# Patient Record
Sex: Female | Born: 1980
Health system: Southern US, Community
[De-identification: ages and names within clinical notes are randomized; demographics above are authoritative.]

## PROBLEM LIST (undated history)

## (undated) DIAGNOSIS — Z8249 Family history of ischemic heart disease and other diseases of the circulatory system: Secondary | ICD-10-CM

## (undated) DIAGNOSIS — I071 Rheumatic tricuspid insufficiency: Secondary | ICD-10-CM

## (undated) DIAGNOSIS — Z141 Cystic fibrosis carrier: Secondary | ICD-10-CM

## (undated) DIAGNOSIS — N6320 Unspecified lump in the left breast, unspecified quadrant: Secondary | ICD-10-CM

## (undated) HISTORY — DX: Unspecified lump in the left breast, unspecified quadrant: N63.20

## (undated) HISTORY — PX: LASIK: SHX215

## (undated) HISTORY — DX: Rheumatic tricuspid insufficiency: I07.1

## (undated) HISTORY — DX: Cystic fibrosis carrier: Z14.1

## (undated) HISTORY — DX: Family history of ischemic heart disease and other diseases of the circulatory system: Z82.49

## (undated) HISTORY — PX: WISDOM TOOTH EXTRACTION: SHX21

---

## 2009-09-19 ENCOUNTER — Other Ambulatory Visit: Admission: RE | Admit: 2009-09-19 | Discharge: 2009-09-19 | Payer: Self-pay | Admitting: Family Medicine

## 2011-07-11 ENCOUNTER — Other Ambulatory Visit: Payer: Self-pay | Admitting: Family Medicine

## 2011-07-11 MED ORDER — SCOPOLAMINE 1 MG/3DAYS TD PT72: 1.0000 | MEDICATED_PATCH | TRANSDERMAL | Status: AC

## 2012-01-16 LAB — OB RESULTS CONSOLE RPR: RPR: NONREACTIVE

## 2012-01-16 LAB — OB RESULTS CONSOLE GC/CHLAMYDIA
Chlamydia: NEGATIVE
Gonorrhea: NEGATIVE

## 2012-01-16 LAB — OB RESULTS CONSOLE RUBELLA ANTIBODY, IGM: Rubella: IMMUNE

## 2012-01-16 LAB — OB RESULTS CONSOLE ABO/RH: RH Type: POSITIVE

## 2012-01-16 LAB — OB RESULTS CONSOLE HEPATITIS B SURFACE ANTIGEN: Hepatitis B Surface Ag: NEGATIVE

## 2012-01-16 LAB — OB RESULTS CONSOLE GBS: GBS: NEGATIVE

## 2012-01-16 LAB — OB RESULTS CONSOLE ANTIBODY SCREEN: Antibody Screen: NEGATIVE

## 2012-01-16 LAB — OB RESULTS CONSOLE HIV ANTIBODY (ROUTINE TESTING): HIV: NONREACTIVE

## 2012-02-28 ENCOUNTER — Other Ambulatory Visit: Payer: Self-pay | Admitting: Family Medicine

## 2012-02-28 MED ORDER — CEPHALEXIN 500 MG PO CAPS: 500.0000 mg | ORAL_CAPSULE | Freq: Two times a day (BID) | ORAL | Status: DC

## 2012-03-04 NOTE — L&D Delivery Note (Signed)
Delivery Note At 5:18 PM a healthy female was delivered via Vaginal, Spontaneous Delivery (Presentation: ; Occiput Anterior).  APGAR: 9,9 ; weight pending.   Placenta status: Intact, Spontaneous.  Cord: 3 vessels with the following complications: None.    Anesthesia:  Epidural  Episiotomy: none Lacerations: Second degree, right labial Suture Repair: 3.0 vicryl rapide Est. Blood Loss (mL): 350cc  Mom to postpartum.  Baby to stay with mother. D/w pt circumcision in detail and desires to proceed.  Oliver Pila 08/07/2012, 5:49 PM

## 2012-07-07 LAB — OB RESULTS CONSOLE GBS: GBS: POSITIVE

## 2012-07-17 ENCOUNTER — Other Ambulatory Visit: Payer: Self-pay | Admitting: Obstetrics and Gynecology

## 2012-07-17 DIAGNOSIS — N63 Unspecified lump in unspecified breast: Secondary | ICD-10-CM

## 2012-07-21 ENCOUNTER — Other Ambulatory Visit: Payer: Self-pay | Admitting: Obstetrics and Gynecology

## 2012-07-21 ENCOUNTER — Ambulatory Visit
Admission: RE | Admit: 2012-07-21 | Discharge: 2012-07-21 | Disposition: A | Discharge status: Home / Self Care | Payer: 59 | Source: Ambulatory Visit | Attending: Obstetrics and Gynecology | Admitting: Obstetrics and Gynecology

## 2012-07-21 DIAGNOSIS — N63 Unspecified lump in unspecified breast: Secondary | ICD-10-CM

## 2012-08-03 ENCOUNTER — Telehealth (HOSPITAL_COMMUNITY): Payer: Self-pay | Admitting: *Deleted

## 2012-08-04 ENCOUNTER — Telehealth (HOSPITAL_COMMUNITY): Payer: Self-pay | Admitting: *Deleted

## 2012-08-04 ENCOUNTER — Encounter (HOSPITAL_COMMUNITY): Payer: Self-pay | Admitting: *Deleted

## 2012-08-04 NOTE — Telephone Encounter (Signed)
Preadmission screen  

## 2012-08-06 ENCOUNTER — Inpatient Hospital Stay (HOSPITAL_COMMUNITY): Admission: AD | Admit: 2012-08-06 | Payer: Self-pay | Source: Ambulatory Visit | Admitting: Obstetrics and Gynecology

## 2012-08-06 NOTE — H&P (Signed)
Amanda Costa is a 33 y.o. female G1P0 at 81 1/7 weeks (EDD 08/06/12 by 8 wek [place]) presenting for IOL given term status and favorable cervix.  PRenatal care complicated by +GBS status.  Pt is a CF carrier, but husband negative.  She noted an enlarged nodule in left axilla and recent US confirmed a slightly enlarged node, thought to be reactive.  Plan recheck in 4-6 weeks if does not resolve.  Maternal Medical History:  Contractions: Frequency: irregular.   Perceived severity is mild.    Fetal activity: Perceived fetal activity is normal.    Prenatal Complications - Diabetes: none.    OB History   Grav Para Term Preterm Abortions TAB SAB Ect Mult Living   1              Past Medical History  Diagnosis Date  . Tricuspid regurgitation   . Cystic fibrosis carrier   . Left breast lump     1-2 cm knot L axilla during end of pregnancy   Past Surgical History  Procedure Laterality Date  . Wisdom tooth extraction     Family History: family history includes Alcohol abuse in her paternal aunt, paternal grandfather, paternal grandmother, and paternal uncle; Arthritis in her maternal grandmother and mother; Asthma in her mother; Cancer in her paternal grandmother; Diabetes in her father and paternal grandmother; Hearing loss in her maternal uncle; Heart disease in her maternal uncle and mother; Hypertension in her mother; and Hypothyroidism in her mother. Social History:  reports that she has never smoked. She has never used smokeless tobacco. She reports that she does not drink alcohol or use illicit drugs.   Prenatal Transfer Tool  Maternal Diabetes: No Genetic Screening: Normal Maternal Ultrasounds/Referrals: Normal Fetal Ultrasounds or other Referrals:  None Maternal Substance Abuse:  No Significant Maternal Medications:  None Significant Maternal Lab Results:  Lab values include: Group B Strep positive Other Comments:  None  ROS    Last menstrual period 10/25/2011. Exam Physical  Exam  Constitutional: She is oriented to person, place, and time. She appears well-developed and well-nourished.  Cardiovascular: Normal rate and regular rhythm.   Respiratory: Effort normal and breath sounds normal.  GI: Soft. Bowel sounds are normal.  Genitourinary: Vagina normal and uterus normal.  Gravid Cervix 50/3/-2  Neurological: She is alert and oriented to person, place, and time.  Psychiatric: She has a normal mood and affect. Her behavior is normal.    Prenatal labs: ABO, Rh: A/Positive/-- (11/14 0000) Antibody: Negative (11/14 0000) Rubella: Immune (11/14 0000) RPR: Nonreactive (11/14 0000)  HBsAg: Negative (11/14 0000)  HIV: Non-reactive (11/14 0000)  GBS: Positive (05/06 0000)  First trimester screen and AFP WNL One hour GTT WNL  Assessment/Plan: Pt here for IOL at term.  Cervix favorable, plan AROM and pitocin.  +GBS will Rx with PCN.   Oliver Pila 08/06/2012, 10:06 PM

## 2012-08-07 ENCOUNTER — Inpatient Hospital Stay (HOSPITAL_COMMUNITY): Payer: 59 | Admitting: Anesthesiology

## 2012-08-07 ENCOUNTER — Encounter (HOSPITAL_COMMUNITY): Payer: Self-pay | Admitting: Anesthesiology

## 2012-08-07 ENCOUNTER — Encounter (HOSPITAL_COMMUNITY): Payer: Self-pay

## 2012-08-07 ENCOUNTER — Inpatient Hospital Stay (HOSPITAL_COMMUNITY)
Admission: RE | Admit: 2012-08-07 | Discharge: 2012-08-09 | DRG: 775 | Disposition: A | Discharge status: Home / Self Care | Payer: 59 | Source: Ambulatory Visit | Attending: Obstetrics and Gynecology | Admitting: Obstetrics and Gynecology

## 2012-08-07 DIAGNOSIS — Z22330 Carrier of Group B streptococcus: Secondary | ICD-10-CM

## 2012-08-07 DIAGNOSIS — O99892 Other specified diseases and conditions complicating childbirth: Secondary | ICD-10-CM | POA: Diagnosis present

## 2012-08-07 LAB — CBC
HCT: 34.1 % — ABNORMAL LOW (ref 36.0–46.0)
Hemoglobin: 11.8 g/dL — ABNORMAL LOW (ref 12.0–15.0)
MCH: 31.6 pg (ref 26.0–34.0)
MCHC: 34.6 g/dL (ref 30.0–36.0)
MCV: 91.4 fL (ref 78.0–100.0)
Platelets: 146 10*3/uL — ABNORMAL LOW (ref 150–400)
RBC: 3.73 MIL/uL — ABNORMAL LOW (ref 3.87–5.11)
RDW: 12.5 % (ref 11.5–15.5)
WBC: 9.8 10*3/uL (ref 4.0–10.5)

## 2012-08-07 LAB — RPR: RPR Ser Ql: NONREACTIVE

## 2012-08-07 MED ORDER — FENTANYL 2.5 MCG/ML BUPIVACAINE 1/10 % EPIDURAL INFUSION (WH - ANES)
14.0000 mL/h | INTRAMUSCULAR | Status: DC | PRN
Start: 2012-08-07 — End: 2012-08-07
  Administered 2012-08-07: 14 mL/h via EPIDURAL
  Filled 2012-08-07: qty 125

## 2012-08-07 MED ORDER — OXYCODONE-ACETAMINOPHEN 5-325 MG PO TABS
1.0000 | ORAL_TABLET | ORAL | Status: DC | PRN
  Administered 2012-08-08 – 2012-08-09 (×2): 1 via ORAL
  Filled 2012-08-07 (×2): qty 1

## 2012-08-07 MED ORDER — TETANUS-DIPHTH-ACELL PERTUSSIS 5-2.5-18.5 LF-MCG/0.5 IM SUSP: 0.5000 mL | Freq: Once | INTRAMUSCULAR | Status: DC

## 2012-08-07 MED ORDER — PENICILLIN G POTASSIUM 5000000 UNITS IJ SOLR
5.0000 10*6.[IU] | Freq: Once | INTRAVENOUS | Status: AC
  Administered 2012-08-07: 5 10*6.[IU] via INTRAVENOUS
  Filled 2012-08-07: qty 5

## 2012-08-07 MED ORDER — LIDOCAINE HCL (PF) 1 % IJ SOLN
INTRAMUSCULAR | Status: DC | PRN
  Administered 2012-08-07 (×4): 4 mL

## 2012-08-07 MED ORDER — IBUPROFEN 600 MG PO TABS
600.0000 mg | ORAL_TABLET | Freq: Four times a day (QID) | ORAL | Status: DC | PRN
  Administered 2012-08-07: 600 mg via ORAL
  Filled 2012-08-07: qty 1

## 2012-08-07 MED ORDER — LACTATED RINGERS IV SOLN
500.0000 mL | INTRAVENOUS | Status: DC | PRN
  Administered 2012-08-07: 1000 mL via INTRAVENOUS

## 2012-08-07 MED ORDER — TERBUTALINE SULFATE 1 MG/ML IJ SOLN: 0.2500 mg | Freq: Once | INTRAMUSCULAR | Status: DC | PRN

## 2012-08-07 MED ORDER — OXYTOCIN 40 UNITS IN LACTATED RINGERS INFUSION - SIMPLE MED
1.0000 m[IU]/min | INTRAVENOUS | Status: DC
  Administered 2012-08-07: 4 m[IU]/min via INTRAVENOUS
  Administered 2012-08-07: 2 m[IU]/min via INTRAVENOUS
  Administered 2012-08-07 (×2): 6 m[IU]/min via INTRAVENOUS
  Filled 2012-08-07: qty 1000

## 2012-08-07 MED ORDER — BUTORPHANOL TARTRATE 1 MG/ML IJ SOLN: 1.0000 mg | INTRAMUSCULAR | Status: DC | PRN

## 2012-08-07 MED ORDER — PRENATAL MULTIVITAMIN CH
1.0000 | ORAL_TABLET | Freq: Every day | ORAL | Status: DC
  Administered 2012-08-08: 1 via ORAL
  Filled 2012-08-07: qty 1

## 2012-08-07 MED ORDER — DIPHENHYDRAMINE HCL 50 MG/ML IJ SOLN: 12.5000 mg | INTRAMUSCULAR | Status: DC | PRN

## 2012-08-07 MED ORDER — OXYTOCIN BOLUS FROM INFUSION: 500.0000 mL | INTRAVENOUS | Status: DC

## 2012-08-07 MED ORDER — WITCH HAZEL-GLYCERIN EX PADS
1.0000 "application " | MEDICATED_PAD | CUTANEOUS | Status: DC | PRN
  Administered 2012-08-07: 1 via TOPICAL

## 2012-08-07 MED ORDER — LACTATED RINGERS IV SOLN
INTRAVENOUS | Status: DC
  Administered 2012-08-07: 1000 mL via INTRAVENOUS

## 2012-08-07 MED ORDER — PHENYLEPHRINE 40 MCG/ML (10ML) SYRINGE FOR IV PUSH (FOR BLOOD PRESSURE SUPPORT)
80.0000 ug | PREFILLED_SYRINGE | INTRAVENOUS | Status: DC | PRN
  Filled 2012-08-07: qty 2

## 2012-08-07 MED ORDER — ONDANSETRON HCL 4 MG/2ML IJ SOLN: 4.0000 mg | Freq: Four times a day (QID) | INTRAMUSCULAR | Status: DC | PRN

## 2012-08-07 MED ORDER — ONDANSETRON HCL 4 MG PO TABS: 4.0000 mg | ORAL_TABLET | ORAL | Status: DC | PRN

## 2012-08-07 MED ORDER — PHENYLEPHRINE 40 MCG/ML (10ML) SYRINGE FOR IV PUSH (FOR BLOOD PRESSURE SUPPORT)
80.0000 ug | PREFILLED_SYRINGE | INTRAVENOUS | Status: DC | PRN
  Filled 2012-08-07: qty 5
  Filled 2012-08-07: qty 2

## 2012-08-07 MED ORDER — LIDOCAINE HCL (PF) 1 % IJ SOLN
30.0000 mL | INTRAMUSCULAR | Status: DC | PRN
  Filled 2012-08-07: qty 30

## 2012-08-07 MED ORDER — ONDANSETRON HCL 4 MG/2ML IJ SOLN: 4.0000 mg | INTRAMUSCULAR | Status: DC | PRN

## 2012-08-07 MED ORDER — OXYCODONE-ACETAMINOPHEN 5-325 MG PO TABS: 1.0000 | ORAL_TABLET | ORAL | Status: DC | PRN

## 2012-08-07 MED ORDER — LACTATED RINGERS IV SOLN: 500.0000 mL | Freq: Once | INTRAVENOUS | Status: DC

## 2012-08-07 MED ORDER — CITRIC ACID-SODIUM CITRATE 334-500 MG/5ML PO SOLN: 30.0000 mL | ORAL | Status: DC | PRN

## 2012-08-07 MED ORDER — SIMETHICONE 80 MG PO CHEW: 80.0000 mg | CHEWABLE_TABLET | ORAL | Status: DC | PRN

## 2012-08-07 MED ORDER — BENZOCAINE-MENTHOL 20-0.5 % EX AERO
1.0000 "application " | INHALATION_SPRAY | CUTANEOUS | Status: DC | PRN
  Administered 2012-08-07: 1 via TOPICAL
  Filled 2012-08-07: qty 56

## 2012-08-07 MED ORDER — EPHEDRINE 5 MG/ML INJ
10.0000 mg | INTRAVENOUS | Status: DC | PRN
  Filled 2012-08-07: qty 4
  Filled 2012-08-07: qty 2

## 2012-08-07 MED ORDER — IBUPROFEN 600 MG PO TABS
600.0000 mg | ORAL_TABLET | Freq: Four times a day (QID) | ORAL | Status: DC
  Administered 2012-08-08 – 2012-08-09 (×6): 600 mg via ORAL
  Filled 2012-08-07 (×6): qty 1

## 2012-08-07 MED ORDER — EPHEDRINE 5 MG/ML INJ
10.0000 mg | INTRAVENOUS | Status: DC | PRN
  Filled 2012-08-07: qty 2

## 2012-08-07 MED ORDER — ACETAMINOPHEN 325 MG PO TABS: 650.0000 mg | ORAL_TABLET | ORAL | Status: DC | PRN

## 2012-08-07 MED ORDER — DIPHENHYDRAMINE HCL 25 MG PO CAPS: 25.0000 mg | ORAL_CAPSULE | Freq: Four times a day (QID) | ORAL | Status: DC | PRN

## 2012-08-07 MED ORDER — OXYTOCIN 40 UNITS IN LACTATED RINGERS INFUSION - SIMPLE MED: 62.5000 mL/h | INTRAVENOUS | Status: DC

## 2012-08-07 MED ORDER — LANOLIN HYDROUS EX OINT: TOPICAL_OINTMENT | CUTANEOUS | Status: DC | PRN

## 2012-08-07 MED ORDER — PENICILLIN G POTASSIUM 5000000 UNITS IJ SOLR
2.5000 10*6.[IU] | INTRAVENOUS | Status: DC
  Administered 2012-08-07: 2.5 10*6.[IU] via INTRAVENOUS
  Filled 2012-08-07 (×5): qty 2.5

## 2012-08-07 MED ORDER — ZOLPIDEM TARTRATE 5 MG PO TABS: 5.0000 mg | ORAL_TABLET | Freq: Every evening | ORAL | Status: DC | PRN

## 2012-08-07 MED ORDER — SENNOSIDES-DOCUSATE SODIUM 8.6-50 MG PO TABS
2.0000 | ORAL_TABLET | Freq: Every day | ORAL | Status: DC
  Administered 2012-08-07 – 2012-08-08 (×2): 2 via ORAL

## 2012-08-07 MED ORDER — DIBUCAINE 1 % RE OINT
1.0000 "application " | TOPICAL_OINTMENT | RECTAL | Status: DC | PRN
  Administered 2012-08-07 – 2012-08-09 (×2): 1 via RECTAL
  Filled 2012-08-07 (×2): qty 28

## 2012-08-07 NOTE — Anesthesia Procedure Notes (Signed)
Epidural Patient location during procedure: OB Start time: 08/07/2012 1:36 PM  Staffing Performed by: anesthesiologist   Preanesthetic Checklist Completed: patient identified, site marked, surgical consent, pre-op evaluation, timeout performed, IV checked, risks and benefits discussed and monitors and equipment checked  Epidural Patient position: sitting Prep: site prepped and draped and DuraPrep Patient monitoring: continuous pulse ox and blood pressure Approach: midline Injection technique: LOR air  Needle:  Needle type: Tuohy  Needle gauge: 17 G Needle length: 9 cm and 9 Needle insertion depth: 5 cm cm Catheter type: closed end flexible Catheter size: 19 Gauge Catheter at skin depth: 10 cm Test dose: negative  Assessment Events: blood not aspirated, injection not painful, no injection resistance, negative IV test and no paresthesia  Additional Notes Discussed risk of headache, infection, bleeding, nerve injury and failed or incomplete block.  Patient voices understanding and wishes to proceed.  Epidural placed easily on first attempt. No paresthesia.  Patient tolerated procedure well with no apparent complications.  Jasmine December, MDReason for block:procedure for pain

## 2012-08-07 NOTE — Progress Notes (Signed)
Patient ID: Amanda Costa, female   DOB: 10-Nov-1980, 32 y.o.   MRN: [id number] Pt very uncomfortable afeb VSS FHR reassuring Cervix 80/5+/0 per RN exam Awaiting epidural

## 2012-08-07 NOTE — Anesthesia Preprocedure Evaluation (Signed)
Anesthesia Evaluation  Patient identified by MRN, date of birth, ID band Patient awake    Reviewed: Allergy & Precautions, H&P , NPO status , Patient's Chart, lab work & pertinent test results, reviewed documented beta blocker date and time   History of Anesthesia Complications Negative for: history of anesthetic complications  Airway Mallampati: I TM Distance: >3 FB Neck ROM: full    Dental  (+) Teeth Intact   Pulmonary neg pulmonary ROS,  breath sounds clear to auscultation        Cardiovascular + Valvular Problems/Murmurs (tricuspid regurg - asymptomatic) Rhythm:regular Rate:Normal + Systolic murmurs    Neuro/Psych negative neurological ROS  negative psych ROS   GI/Hepatic negative GI ROS, Neg liver ROS,   Endo/Other  negative endocrine ROS  Renal/GU negative Renal ROS  negative genitourinary   Musculoskeletal   Abdominal   Peds  Hematology negative hematology ROS (+)   Anesthesia Other Findings   Reproductive/Obstetrics (+) Pregnancy                           Anesthesia Physical Anesthesia Plan  ASA: II  Anesthesia Plan: Epidural   Post-op Pain Management:    Induction:   Airway Management Planned:   Additional Equipment:   Intra-op Plan:   Post-operative Plan:   Informed Consent: I have reviewed the patients History and Physical, chart, labs and discussed the procedure including the risks, benefits and alternatives for the proposed anesthesia with the patient or authorized representative who has indicated his/her understanding and acceptance.     Plan Discussed with:   Anesthesia Plan Comments:         Anesthesia Quick Evaluation

## 2012-08-07 NOTE — Progress Notes (Signed)
   Subjective: Pt doing well, minimal contractions, not yet on pitocin  Objective: BP 128/77  Pulse 73  Temp(Src) 98.2 F (36.8 C) (Oral)  Resp 18  Ht 5\' 5"  (1.651 m)  Wt 68.947 kg (152 lb)  BMI 25.29 kg/m2  LMP 10/25/2011      FHT:  FHR: 140 bpm, variability: moderate,  accelerations:  Present,  decelerations:  Absent UC:   irregular, every 5-10 minutes SVE:   Dilation: 3 Effacement (%): 50 Station: -2 Exam by:: Dr Senaida Ores AROM clear  Labs: Lab Results  Component Value Date   WBC 9.8 08/07/2012   HGB 11.8* 08/07/2012   HCT 34.1* 08/07/2012   MCV 91.4 08/07/2012   PLT 146* 08/07/2012    Assessment / Plan: Pt already receiving PCN for +GBS.  Will start pitocin and follow progress.  Plans epidural  Danai Gotto W 08/07/2012, 8:54 AM

## 2012-08-08 ENCOUNTER — Encounter (HOSPITAL_COMMUNITY): Payer: Self-pay

## 2012-08-08 LAB — CBC
HCT: 29.1 % — ABNORMAL LOW (ref 36.0–46.0)
Hemoglobin: 10.2 g/dL — ABNORMAL LOW (ref 12.0–15.0)
MCH: 32.3 pg (ref 26.0–34.0)
MCHC: 35.1 g/dL (ref 30.0–36.0)
MCV: 92.1 fL (ref 78.0–100.0)
Platelets: 127 10*3/uL — ABNORMAL LOW (ref 150–400)
RBC: 3.16 MIL/uL — ABNORMAL LOW (ref 3.87–5.11)
RDW: 12.5 % (ref 11.5–15.5)
WBC: 13.3 10*3/uL — ABNORMAL HIGH (ref 4.0–10.5)

## 2012-08-08 NOTE — Anesthesia Postprocedure Evaluation (Signed)
  Anesthesia Post-op Note  Patient: Amanda Costa  Procedure(s) Performed: * No procedures listed *  Patient Location: Mother/Baby  Anesthesia Type:Epidural  Level of Consciousness: awake and alert   Airway and Oxygen Therapy: Patient Spontanous Breathing  Post-op Pain: mild  Post-op Assessment: Patient's Cardiovascular Status Stable, Respiratory Function Stable, No signs of Nausea or vomiting, Pain level controlled and No headache  Post-op Vital Signs: Reviewed and stable  Complications: No apparent anesthesia complications

## 2012-08-08 NOTE — Progress Notes (Signed)
Post Partum Day 1 Subjective: no complaints, up ad lib and tolerating PO  Objective: Blood pressure 95/59, pulse 60, temperature 98.2 F (36.8 C), temperature source Oral, resp. rate 18, height 5\' 5"  (1.651 m), weight 68.947 kg (152 lb), last menstrual period 10/25/2011, SpO2 99.00%.  Physical Exam:  General: alert and cooperative Lochia: appropriate Uterine Fundus: firm    Recent Labs  08/07/12 0725 08/08/12 0630  HGB 11.8* 10.2*  HCT 34.1* 29.1*    Assessment/Plan: Plan for discharge tomorrow   LOS: 1 day   Amanda Costa W 08/08/2012, 7:48 AM

## 2012-08-08 NOTE — Discharge Summary (Signed)
Obstetric Discharge Summary Reason for Admission: induction of labor Prenatal Procedures: none Intrapartum Procedures: spontaneous vaginal delivery Postpartum Procedures: none Complications-Operative and Postpartum: second degree perineal laceration, small right periurethral Hemoglobin  Date Value Range Status  08/07/2012 11.8* 12.0 - 15.0 g/dL Final     HCT  Date Value Range Status  08/07/2012 34.1* 36.0 - 46.0 % Final    Physical Exam:  General: alert and cooperative Lochia: appropriate Uterine Fundus: firm   Discharge Diagnoses: Term Pregnancy-delivered  Discharge Information: Date: 08/08/2012 Activity: pelvic rest Diet: routine Medications: Ibuprofen and Percocet Condition: stable Instructions: refer to practice specific booklet Discharge to: home Follow-up Information   Follow up with Oliver Pila, MD. Schedule an appointment as soon as possible for a visit in 6 weeks.   Contact information:   [address], [address] [place] [address] [phone]       Newborn Data: Live born female  Birth Weight: 8 lb 10.5 oz (3925 g) APGAR: 9, 9  Home with mother.  Oliver Pila 08/08/2012, 6:21 AM

## 2012-08-09 MED ORDER — OXYCODONE-ACETAMINOPHEN 5-325 MG PO TABS: 1.0000 | ORAL_TABLET | ORAL | Status: DC | PRN

## 2012-08-09 MED ORDER — IBUPROFEN 600 MG PO TABS: 600.0000 mg | ORAL_TABLET | Freq: Four times a day (QID) | ORAL | Status: DC

## 2012-08-09 NOTE — Progress Notes (Signed)
Post Partum Day 2 Subjective: no complaints, up ad lib and tolerating PO  Objective: Blood pressure 109/61, pulse 62, temperature 98.3 F (36.8 C), temperature source Oral, resp. rate 17, height 5\' 5"  (1.651 m), weight 68.947 kg (152 lb), last menstrual period 10/25/2011, SpO2 99.00%, unknown if currently breastfeeding.  Physical Exam:  General: alert and cooperative Lochia: appropriate Uterine Fundus: firm    Recent Labs  08/07/12 0725 08/08/12 0630  HGB 11.8* 10.2*  HCT 34.1* 29.1*    Assessment/Plan: Discharge home   LOS: 2 days   Samayah Novinger W 08/09/2012, 8:36 AM

## 2012-08-10 ENCOUNTER — Other Ambulatory Visit: Payer: Self-pay | Admitting: Obstetrics and Gynecology

## 2012-08-10 DIAGNOSIS — N63 Unspecified lump in unspecified breast: Secondary | ICD-10-CM

## 2012-08-18 ENCOUNTER — Ambulatory Visit
Admission: RE | Admit: 2012-08-18 | Discharge: 2012-08-18 | Disposition: A | Discharge status: Home / Self Care | Payer: 59 | Source: Ambulatory Visit | Attending: Obstetrics and Gynecology | Admitting: Obstetrics and Gynecology

## 2012-08-18 DIAGNOSIS — N63 Unspecified lump in unspecified breast: Secondary | ICD-10-CM

## 2012-08-20 NOTE — Progress Notes (Signed)
Ur chart review completed.  

## 2012-08-21 ENCOUNTER — Encounter (HOSPITAL_COMMUNITY): Payer: Self-pay

## 2012-09-01 ENCOUNTER — Encounter (INDEPENDENT_AMBULATORY_CARE_PROVIDER_SITE_OTHER): Payer: Self-pay | Admitting: General Surgery

## 2012-09-01 ENCOUNTER — Ambulatory Visit (INDEPENDENT_AMBULATORY_CARE_PROVIDER_SITE_OTHER): Payer: Commercial Managed Care - PPO | Admitting: General Surgery

## 2012-09-01 VITALS — BP 118/70 | HR 68 | Temp 98.2°F | Resp 14 | Ht 65.0 in | Wt 133.4 lb

## 2012-09-01 DIAGNOSIS — R229 Localized swelling, mass and lump, unspecified: Secondary | ICD-10-CM

## 2012-09-01 DIAGNOSIS — R2232 Localized swelling, mass and lump, left upper limb: Secondary | ICD-10-CM

## 2012-09-01 NOTE — Progress Notes (Signed)
Patient ID: Amanda Costa, female   DOB: [birth year], 32 y.o.   MRN: [id number]  Chief Complaint  Patient presents with  . New Evaluation    eval lt axillary    HPI Amanda Costa is a 32 y.o. female.  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 Prior to delivery she noted a left axillary mass that may have been associated with the breaking her skin from shaving. This area was followed by ultrasound and enlarged over some time. It appeared to be due to an infection and this was reactive lymphadenopathy. Her last ultrasound is listed below. She has no prior history of any breast complaints or any issues. She has no family history. She has not been treated with any antibiotics or any other specific treatments for this mass. Since the last ultrasound of this area is gone down considerably and she really has no complaints referable to this area right now. Past Medical History  Diagnosis Date  . Tricuspid regurgitation   . Cystic fibrosis carrier   . Left breast lump     1-2 cm knot L axilla during end of pregnancy    Past Surgical History  Procedure Laterality Date  . Wisdom tooth extraction      Family History  Problem Relation Age of Onset  . Arthritis Mother   . Asthma Mother   . Heart disease Mother   . Hypertension Mother   . Hypothyroidism Mother   . Diabetes Father   . Heart disease Maternal Uncle   . Hearing loss Maternal Uncle   . Alcohol abuse Paternal Aunt   . Alcohol abuse Paternal Uncle   . Arthritis Maternal Grandmother   . Alcohol abuse Paternal Grandmother   . Diabetes Paternal Grandmother   . Cancer Paternal Grandmother     skin  . Alcohol abuse Paternal Grandfather     Social History History  Substance Use Topics  . Smoking status: Never Smoker   . Smokeless tobacco: Never Used  . Alcohol Use: No    No Known  Allergies  Current Outpatient Prescriptions  Medication Sig Dispense Refill  . Prenatal Vit-Fe Fumarate-FA (PRENATAL MULTIVITAMIN) TABS Take 1 tablet by mouth daily at 12 noon.      Marland Kitchen ibuprofen (ADVIL,MOTRIN) 600 MG tablet Take 1 tablet (600 mg total) by mouth every 6 (six) hours.  30 tablet  0  . oxyCODONE-acetaminophen (PERCOCET/ROXICET) 5-325 MG per tablet Take 1-2 tablets by mouth every 4 (four) hours as needed.  30 tablet  0   No current facility-administered medications for this visit.    Review of Systems Review of Systems  Constitutional: Negative for fever, chills and unexpected weight change.  HENT: Negative for hearing loss, congestion, sore throat, trouble swallowing and voice change.   Eyes: Negative for visual disturbance.  Respiratory: Negative for cough and wheezing.   Cardiovascular: Negative for chest pain, palpitations and leg swelling.  Gastrointestinal: Negative for nausea, vomiting, abdominal pain, diarrhea, constipation, blood in stool, abdominal distention and anal bleeding.  Genitourinary: Negative for hematuria, vaginal bleeding and difficulty urinating.  Musculoskeletal: Negative for arthralgias.  Skin: Negative for rash and wound.  Neurological: Negative for seizures, syncope and headaches.  Hematological: Negative for adenopathy. Does not bruise/bleed easily.  Psychiatric/Behavioral: Negative for confusion.    Blood pressure 118/70, pulse 68, temperature 98.2 F (36.8 C), temperature source Temporal,  resp. rate 14, height 5\' 5"  (1.651 m), weight 133 lb 6.4 oz (60.51 kg), last menstrual period 10/25/2011.  Physical Exam Physical Exam  Vitals reviewed. Constitutional: She appears well-developed and well-nourished.  Pulmonary/Chest: Right breast exhibits no inverted nipple, no mass, no nipple discharge, no skin change and no tenderness. Left breast exhibits no inverted nipple, no mass, no nipple discharge, no skin change and no tenderness.   Lymphadenopathy:    She has no cervical adenopathy.    She has axillary adenopathy.       Right axillary: No pectoral and no lateral adenopathy present.       Left axillary: Lateral (small less than 1 cm palpable axillary node, mobile nontender, no evidence infection) adenopathy present.       Right: No supraclavicular adenopathy present.       Left: No supraclavicular adenopathy present.    Data Reviewed LEFT BREAST ULTRASOUND  Comparison: Jul 29, 2012  Findings: Ultrasound is performed, showing a multicystic lesion in  the palpable area left axilla which has enlarged measuring 2.9 x 1  x 1.4 cm. This could be a focal area of infection/abscess.  IMPRESSION:  Suspicious findings.  RECOMMENDATION:  Surgical excision of palpable area left axilla.  I have discussed the findings and recommendations with the patient.  Results were also provided in writing at the conclusion of the  visit. If applicable, a reminder letter will be sent to the  patient regarding the next appointment.  BI-RADS CATEGORY 4: Suspicious abnormality - biopsy should be  considered.   Assessment    Left axillary mass    Plan    I examined as well as reviewed both of her ultrasounds. Clinically this area is nearly gone on her exam right now compared to her last ultrasound and she correlates this as well. I think is to work with this does appear to be reactive lymphadenopathy. I do think the best plan right now would be to just follow this I think there is a very low suspicion that this is either lymphoma or associated with the breast cancer. I will plan on seeing her back in 3 weeks. If this area completely disappears on her exam I asked her to call me I will set her up for an ultrasound to document that. If there is area does not completely go away or enlarges again then we will need to biopsy this. I discussed possible antibiotics. I think with her breast-feeding the fact this is already getting better on its own  that we will hold off on this for now as well.        [name] 09/01/2012, 10:16 AM

## 2012-09-28 ENCOUNTER — Encounter (INDEPENDENT_AMBULATORY_CARE_PROVIDER_SITE_OTHER): Payer: Commercial Managed Care - PPO | Admitting: General Surgery

## 2014-01-03 ENCOUNTER — Encounter (INDEPENDENT_AMBULATORY_CARE_PROVIDER_SITE_OTHER): Payer: Self-pay | Admitting: General Surgery

## 2014-02-08 ENCOUNTER — Ambulatory Visit: Payer: 59 | Admitting: Family Medicine

## 2014-10-19 LAB — OB RESULTS CONSOLE RPR: RPR: NONREACTIVE

## 2014-10-19 LAB — OB RESULTS CONSOLE HIV ANTIBODY (ROUTINE TESTING): HIV: NONREACTIVE

## 2014-10-19 LAB — OB RESULTS CONSOLE HEPATITIS B SURFACE ANTIGEN: Hepatitis B Surface Ag: NEGATIVE

## 2014-10-19 LAB — OB RESULTS CONSOLE ABO/RH: RH Type: POSITIVE

## 2014-10-19 LAB — OB RESULTS CONSOLE GC/CHLAMYDIA
Chlamydia: NEGATIVE
Gonorrhea: NEGATIVE

## 2014-10-19 LAB — OB RESULTS CONSOLE RUBELLA ANTIBODY, IGM: Rubella: IMMUNE

## 2014-10-19 LAB — OB RESULTS CONSOLE ANTIBODY SCREEN: Antibody Screen: NEGATIVE

## 2015-03-05 NOTE — L&D Delivery Note (Signed)
Delivery Note At 3:15 AM a viable female was delivered via Vaginal, Spontaneous Delivery (Presentation: Right Occiput Anterior).  APGAR: 9, 9; weight pending.   Placenta status: Intact, Spontaneous.  Cord: 3 vessels with the following complications: None.  Anesthesia: Epidural  Episiotomy: None Lacerations: 2nd degree Suture Repair: 3.0 vicryl rapide Est. Blood Loss (mL): 200  Mom to postpartum.  Baby to Couplet care / Skin to Skin.  Discussed circumcision procedure and risks.  Sophonie Goforth D 04/27/2015, 3:43 AM

## 2015-03-21 ENCOUNTER — Ambulatory Visit (INDEPENDENT_AMBULATORY_CARE_PROVIDER_SITE_OTHER): Payer: 59 | Admitting: Internal Medicine

## 2015-03-21 ENCOUNTER — Encounter: Payer: Self-pay | Admitting: Internal Medicine

## 2015-03-21 ENCOUNTER — Other Ambulatory Visit (INDEPENDENT_AMBULATORY_CARE_PROVIDER_SITE_OTHER): Payer: 59

## 2015-03-21 VITALS — BP 118/60 | HR 103 | Temp 99.5°F | Wt 152.0 lb

## 2015-03-21 DIAGNOSIS — O0001 Abdominal pregnancy with intrauterine pregnancy: Secondary | ICD-10-CM

## 2015-03-21 DIAGNOSIS — J069 Acute upper respiratory infection, unspecified: Secondary | ICD-10-CM | POA: Diagnosis not present

## 2015-03-21 DIAGNOSIS — Z8249 Family history of ischemic heart disease and other diseases of the circulatory system: Secondary | ICD-10-CM | POA: Diagnosis not present

## 2015-03-21 HISTORY — DX: Family history of ischemic heart disease and other diseases of the circulatory system: Z82.49

## 2015-03-21 LAB — CBC WITH DIFFERENTIAL/PLATELET
Basophils Absolute: 0.1 10*3/uL (ref 0.0–0.1)
Basophils Relative: 0.4 % (ref 0.0–3.0)
Eosinophils Absolute: 0.1 10*3/uL (ref 0.0–0.7)
Eosinophils Relative: 0.8 % (ref 0.0–5.0)
HCT: 35.7 % — ABNORMAL LOW (ref 36.0–46.0)
Hemoglobin: 11.9 g/dL — ABNORMAL LOW (ref 12.0–15.0)
Lymphocytes Relative: 10.4 % — ABNORMAL LOW (ref 12.0–46.0)
Lymphs Abs: 1.5 10*3/uL (ref 0.7–4.0)
MCHC: 33.5 g/dL (ref 30.0–36.0)
MCV: 94.6 fl (ref 78.0–100.0)
Monocytes Absolute: 1.7 10*3/uL — ABNORMAL HIGH (ref 0.1–1.0)
Monocytes Relative: 11.7 % (ref 3.0–12.0)
Neutro Abs: 10.9 10*3/uL — ABNORMAL HIGH (ref 1.4–7.7)
Neutrophils Relative %: 76.7 % (ref 43.0–77.0)
Platelets: 177 10*3/uL (ref 150.0–400.0)
RBC: 3.77 Mil/uL — ABNORMAL LOW (ref 3.87–5.11)
RDW: 12.6 % (ref 11.5–15.5)
WBC: 14.2 10*3/uL — ABNORMAL HIGH (ref 4.0–10.5)

## 2015-03-21 LAB — HEPATIC FUNCTION PANEL
ALT: 14 U/L (ref 0–35)
AST: 16 U/L (ref 0–37)
Albumin: 3.2 g/dL — ABNORMAL LOW (ref 3.5–5.2)
Alkaline Phosphatase: 91 U/L (ref 39–117)
Bilirubin, Direct: 0 mg/dL (ref 0.0–0.3)
Total Bilirubin: 0.4 mg/dL (ref 0.2–1.2)
Total Protein: 6.7 g/dL (ref 6.0–8.3)

## 2015-03-21 LAB — BASIC METABOLIC PANEL
BUN: 4 mg/dL — ABNORMAL LOW (ref 6–23)
CO2: 24 mEq/L (ref 19–32)
Calcium: 8.2 mg/dL — ABNORMAL LOW (ref 8.4–10.5)
Chloride: 104 mEq/L (ref 96–112)
Creatinine, Ser: 0.48 mg/dL (ref 0.40–1.20)
GFR: 157.98 mL/min (ref >60.00)
Glucose, Bld: 85 mg/dL (ref 70–99)
Potassium: 3.9 mEq/L (ref 3.5–5.1)
Sodium: 135 mEq/L (ref 135–145)

## 2015-03-21 MED ORDER — CEFTRIAXONE SODIUM 1 G IJ SOLR
1.0000 g | Freq: Once | INTRAMUSCULAR | Status: AC
  Administered 2015-03-21: 1 g via INTRAMUSCULAR

## 2015-03-21 MED ORDER — CEFDINIR 300 MG PO CAPS: 300.0000 mg | ORAL_CAPSULE | Freq: Two times a day (BID) | ORAL | Status: DC

## 2015-03-21 MED FILL — CEFDINIR 300 MG CAPSULE: 300 | 10 days supply | Qty: 20 | Fill #0

## 2015-03-21 NOTE — Progress Notes (Signed)
Subjective:  Patient ID: Amanda Costa, female    DOB: 1980-08-23  Age: 35 y.o. MRN: [id number]  CC: No chief complaint on file.   HPI Amanda Costa presents for being sick w/a cold x 1 week - getting  Worse on OTC meds C/o cough since Thur night, green sputum, SOB - worse at night, fever and chills. C/o HA, nasal d/c, sinus pain. No n/v. No syncope.  Outpatient Prescriptions Prior to Visit  Medication Sig Dispense Refill  . Prenatal Vit-Fe Fumarate-FA (PRENATAL MULTIVITAMIN) TABS Take 1 tablet by mouth daily at 12 noon.    Marland Kitchen ibuprofen (ADVIL,MOTRIN) 600 MG tablet Take 1 tablet (600 mg total) by mouth every 6 (six) hours. (Patient not taking: Reported on 03/21/2015) 30 tablet 0  . oxyCODONE-acetaminophen (PERCOCET/ROXICET) 5-325 MG per tablet Take 1-2 tablets by mouth every 4 (four) hours as needed. (Patient not taking: Reported on 03/21/2015) 30 tablet 0   No facility-administered medications prior to visit.    ROS Review of Systems  Constitutional: Positive for fever, chills, diaphoresis and fatigue. Negative for activity change, appetite change and unexpected weight change.  HENT: Positive for congestion, rhinorrhea and sinus pressure. Negative for mouth sores and trouble swallowing.   Eyes: Negative for visual disturbance.  Respiratory: Positive for cough and shortness of breath. Negative for chest tightness and wheezing.   Cardiovascular: Negative for chest pain, palpitations and leg swelling.  Gastrointestinal: Negative for nausea, vomiting and abdominal pain.  Genitourinary: Negative for frequency, difficulty urinating and vaginal pain.  Musculoskeletal: Positive for myalgias and arthralgias. Negative for back pain and gait problem.  Skin: Negative for pallor and rash.  Neurological: Positive for weakness and headaches. Negative for dizziness, tremors, light-headedness and numbness.  Psychiatric/Behavioral: Negative for confusion and sleep disturbance.    Objective:    BP 118/60 mmHg  Pulse 103  Temp(Src) 99.5 F (37.5 C) (Oral)  Wt 152 lb (68.947 kg)  SpO2 97%  BP Readings from Last 3 Encounters:  03/21/15 118/60  09/01/12 118/70  08/09/12 109/61    Wt Readings from Last 3 Encounters:  03/21/15 152 lb (68.947 kg)  09/01/12 133 lb 6.4 oz (60.51 kg)  08/07/12 152 lb (68.947 kg)    Physical Exam  Constitutional: She appears well-developed. She appears distressed.  HENT:  Head: Normocephalic.  Right Ear: External ear normal.  Left Ear: External ear normal.  Nose: Nose normal.  Mouth/Throat: No oropharyngeal exudate.  Eyes: Conjunctivae are normal. Pupils are equal, round, and reactive to light. Right eye exhibits no discharge. Left eye exhibits no discharge.  Neck: Normal range of motion. Neck supple. No JVD present. No tracheal deviation present. No thyromegaly present.  Cardiovascular: Normal rate, regular rhythm and normal heart sounds.   Pulmonary/Chest: No stridor. No respiratory distress. She has no wheezes. She has no rales. She exhibits no tenderness.  Abdominal: Soft. Bowel sounds are normal. She exhibits distension. She exhibits no mass. There is no tenderness. There is no rebound and no guarding.  Musculoskeletal: She exhibits no edema or tenderness.  Lymphadenopathy:    She has no cervical adenopathy.  Neurological: She displays normal reflexes. No cranial nerve deficit. She exhibits normal muscle tone. Coordination normal.  Skin: No rash noted. No erythema.  Psychiatric: She has a normal mood and affect. Her behavior is normal. Judgment and thought content normal.  Looks tired; not dyspneic at rest. No cyanosis. Not septic appearing. HEENT moist, eryth nasal and throat mucosa, grey nasal d/c Decreased BS at bases  Lab Results  Component Value Date   WBC 14.2* 03/21/2015   HGB 11.9* 03/21/2015   HCT 35.7* 03/21/2015   PLT 177.0 03/21/2015   GLUCOSE 85 03/21/2015   ALT 14 03/21/2015   AST 16 03/21/2015   NA 135  03/21/2015   K 3.9 03/21/2015   CL 104 03/21/2015   CREATININE 0.48 03/21/2015   BUN 4* 03/21/2015   CO2 24 03/21/2015    US Breast Left  08/18/2012  *RADIOLOGY REPORT* Clinical Data:  Follow-up for palpable area left axilla LEFT BREAST ULTRASOUND Comparison:  Jul 29, 2012 Findings: Ultrasound is performed, showing  a multicystic lesion in the palpable area left axilla which has enlarged measuring 2.9 x 1 x 1.4 cm. This could be a focal area of infection/abscess. IMPRESSION: Suspicious findings. RECOMMENDATION: Surgical excision of palpable area left axilla. I have discussed the findings and recommendations with the patient. Results were also provided in writing at the conclusion of the visit.  If applicable, a reminder letter will be sent to the patient regarding the next appointment. BI-RADS CATEGORY 4:  Suspicious abnormality - biopsy should be considered. Original Report Authenticated By: Abelardo Diesel, M.D.    Assessment & Plan:   Diagnoses and all orders for this visit:  Acute upper respiratory infection -     Basic metabolic panel; Future -     CBC with Differential/Platelet; Future -     Hepatic function panel; Future -     cefTRIAXone (ROCEPHIN) injection 1 g; Inject 1 g into the muscle once.  Family history of early CAD  Abdominal pregnancy with intrauterine pregnancy  Other orders -     cefdinir (OMNICEF) 300 MG capsule; Take 1 capsule (300 mg total) by mouth 2 (two) times daily.   I have discontinued Ms. Sinyard's ibuprofen and oxyCODONE-acetaminophen. I am also having her start on cefdinir. Additionally, I am having her maintain her prenatal multivitamin. We administered cefTRIAXone.  Meds ordered this encounter  Medications  . cefdinir (OMNICEF) 300 MG capsule    Sig: Take 1 capsule (300 mg total) by mouth 2 (two) times daily.    Dispense:  20 capsule    Refill:  0  . cefTRIAXone (ROCEPHIN) injection 1 g    Sig:      Follow-up: No Follow-up on file.  Walker Kehr, MD

## 2015-03-21 NOTE — Assessment & Plan Note (Addendum)
1/17 probable CAP/sinusitis [redacted] weeks pregnant Roceph 1 g IM now Cefdinir 300 mg bid x 10 d Labs To ER if worse CXR option discuss - will look at labs first

## 2015-03-21 NOTE — Patient Instructions (Addendum)
Use over-the-counter  "cold" medicines  such as "Afrin" nasal spray for nasal congestion as directed instead. Use" Delsym" or" Robitussin" cough syrup  for cough.  You can use plain "Tylenol". Use Halls or Ricola cough drops.   Please, make an appointment if you are not better or if you're worse.

## 2015-03-21 NOTE — Assessment & Plan Note (Signed)
Mother, uncle  Pt had an ECHO

## 2015-03-21 NOTE — Assessment & Plan Note (Signed)
1/17 - 32 wks

## 2015-03-21 NOTE — Progress Notes (Signed)
Pre visit review using our clinic review tool, if applicable. No additional management support is needed unless otherwise documented below in the visit note. 

## 2015-04-13 DIAGNOSIS — Z36 Encounter for antenatal screening of mother: Secondary | ICD-10-CM | POA: Diagnosis not present

## 2015-04-15 LAB — OB RESULTS CONSOLE GBS: GBS: NEGATIVE

## 2015-04-26 ENCOUNTER — Inpatient Hospital Stay (HOSPITAL_COMMUNITY): Payer: 59 | Admitting: Anesthesiology

## 2015-04-26 ENCOUNTER — Encounter (HOSPITAL_COMMUNITY): Payer: Self-pay

## 2015-04-26 ENCOUNTER — Inpatient Hospital Stay (HOSPITAL_COMMUNITY)
Admission: AD | Admit: 2015-04-26 | Discharge: 2015-04-28 | DRG: 775 | Disposition: A | Discharge status: Home / Self Care | Payer: 59 | Source: Ambulatory Visit | Attending: Obstetrics and Gynecology | Admitting: Obstetrics and Gynecology

## 2015-04-26 DIAGNOSIS — Z8249 Family history of ischemic heart disease and other diseases of the circulatory system: Secondary | ICD-10-CM | POA: Diagnosis not present

## 2015-04-26 DIAGNOSIS — Z3A37 37 weeks gestation of pregnancy: Secondary | ICD-10-CM | POA: Diagnosis not present

## 2015-04-26 DIAGNOSIS — Z833 Family history of diabetes mellitus: Secondary | ICD-10-CM | POA: Diagnosis not present

## 2015-04-26 DIAGNOSIS — O4292 Full-term premature rupture of membranes, unspecified as to length of time between rupture and onset of labor: Principal | ICD-10-CM | POA: Diagnosis present

## 2015-04-26 DIAGNOSIS — Z141 Cystic fibrosis carrier: Secondary | ICD-10-CM

## 2015-04-26 DIAGNOSIS — O42013 Preterm premature rupture of membranes, onset of labor within 24 hours of rupture, third trimester: Secondary | ICD-10-CM | POA: Diagnosis present

## 2015-04-26 LAB — CBC
HCT: 33.2 % — ABNORMAL LOW (ref 36.0–46.0)
Hemoglobin: 11.6 g/dL — ABNORMAL LOW (ref 12.0–15.0)
MCH: 31.7 pg (ref 26.0–34.0)
MCHC: 34.9 g/dL (ref 30.0–36.0)
MCV: 90.7 fL (ref 78.0–100.0)
Platelets: 178 10*3/uL (ref 150–400)
RBC: 3.66 MIL/uL — ABNORMAL LOW (ref 3.87–5.11)
RDW: 12.7 % (ref 11.5–15.5)
WBC: 12.5 10*3/uL — ABNORMAL HIGH (ref 4.0–10.5)

## 2015-04-26 LAB — POCT FERN TEST: POCT Fern Test: POSITIVE

## 2015-04-26 LAB — TYPE AND SCREEN
ABO/RH(D): A POS
Antibody Screen: NEGATIVE

## 2015-04-26 MED ORDER — EPHEDRINE 5 MG/ML INJ
10.0000 mg | INTRAVENOUS | Status: DC | PRN
  Filled 2015-04-26: qty 2

## 2015-04-26 MED ORDER — LACTATED RINGERS IV SOLN
INTRAVENOUS | Status: DC
  Administered 2015-04-26: 21:00:00 via INTRAVENOUS

## 2015-04-26 MED ORDER — ONDANSETRON HCL 4 MG/2ML IJ SOLN: 4.0000 mg | Freq: Four times a day (QID) | INTRAMUSCULAR | Status: DC | PRN

## 2015-04-26 MED ORDER — LACTATED RINGERS IV SOLN: 500.0000 mL | INTRAVENOUS | Status: DC | PRN

## 2015-04-26 MED ORDER — FENTANYL 2.5 MCG/ML BUPIVACAINE 1/10 % EPIDURAL INFUSION (WH - ANES)
14.0000 mL/h | INTRAMUSCULAR | Status: DC | PRN
  Administered 2015-04-26 (×2): 14 mL/h via EPIDURAL
  Filled 2015-04-26: qty 125

## 2015-04-26 MED ORDER — OXYTOCIN BOLUS FROM INFUSION
500.0000 mL | INTRAVENOUS | Status: DC
  Administered 2015-04-27: 500 mL via INTRAVENOUS

## 2015-04-26 MED ORDER — DIPHENHYDRAMINE HCL 50 MG/ML IJ SOLN
12.5000 mg | INTRAMUSCULAR | Status: DC | PRN
Start: 2015-04-26 — End: 2015-04-27

## 2015-04-26 MED ORDER — LACTATED RINGERS IV SOLN
INTRAVENOUS | Status: DC
  Administered 2015-04-27: 02:00:00 via INTRAVENOUS

## 2015-04-26 MED ORDER — LIDOCAINE HCL (PF) 1 % IJ SOLN
INTRAMUSCULAR | Status: DC | PRN
  Administered 2015-04-26 (×2): 4 mL

## 2015-04-26 MED ORDER — TERBUTALINE SULFATE 1 MG/ML IJ SOLN
0.2500 mg | Freq: Once | INTRAMUSCULAR | Status: DC | PRN
  Filled 2015-04-26: qty 1

## 2015-04-26 MED ORDER — PHENYLEPHRINE 40 MCG/ML (10ML) SYRINGE FOR IV PUSH (FOR BLOOD PRESSURE SUPPORT)
80.0000 ug | PREFILLED_SYRINGE | INTRAVENOUS | Status: DC | PRN
  Filled 2015-04-26: qty 2
  Filled 2015-04-26: qty 20

## 2015-04-26 MED ORDER — OXYCODONE-ACETAMINOPHEN 5-325 MG PO TABS: 2.0000 | ORAL_TABLET | ORAL | Status: DC | PRN

## 2015-04-26 MED ORDER — OXYTOCIN 10 UNIT/ML IJ SOLN: 2.5000 [IU]/h | INTRAMUSCULAR | Status: DC

## 2015-04-26 MED ORDER — PHENYLEPHRINE 40 MCG/ML (10ML) SYRINGE FOR IV PUSH (FOR BLOOD PRESSURE SUPPORT)
80.0000 ug | PREFILLED_SYRINGE | INTRAVENOUS | Status: DC | PRN
  Filled 2015-04-26: qty 2

## 2015-04-26 MED ORDER — LACTATED RINGERS IV SOLN
1.0000 m[IU]/min | INTRAVENOUS | Status: DC
  Administered 2015-04-27: 2 m[IU]/min via INTRAVENOUS
  Filled 2015-04-26: qty 4

## 2015-04-26 MED ORDER — OXYCODONE-ACETAMINOPHEN 5-325 MG PO TABS: 1.0000 | ORAL_TABLET | ORAL | Status: DC | PRN

## 2015-04-26 MED ORDER — CITRIC ACID-SODIUM CITRATE 334-500 MG/5ML PO SOLN: 30.0000 mL | ORAL | Status: DC | PRN

## 2015-04-26 MED ORDER — LACTATED RINGERS IV SOLN: 500.0000 mL | Freq: Once | INTRAVENOUS | Status: DC

## 2015-04-26 MED ORDER — BUTORPHANOL TARTRATE 1 MG/ML IJ SOLN: 1.0000 mg | INTRAMUSCULAR | Status: DC | PRN

## 2015-04-26 MED ORDER — LIDOCAINE HCL (PF) 1 % IJ SOLN
30.0000 mL | INTRAMUSCULAR | Status: DC | PRN
  Filled 2015-04-26: qty 30

## 2015-04-26 NOTE — Anesthesia Procedure Notes (Signed)
Epidural Patient location during procedure: OB  Staffing Anesthesiologist: Nolon Nations Performed by: anesthesiologist   Preanesthetic Checklist Completed: patient identified, pre-op evaluation, timeout performed, IV checked, risks and benefits discussed and monitors and equipment checked  Epidural Patient position: sitting Prep: site prepped and draped and DuraPrep Patient monitoring: heart rate Approach: midline Location: L2-L3 Injection technique: LOR air and LOR saline  Needle:  Needle type: Tuohy  Needle gauge: 17 G Needle length: 9 cm Needle insertion depth: 5 cm Catheter type: closed end flexible Catheter size: 19 Gauge Catheter at skin depth: 11 cm Test dose: negative  Assessment Sensory level: T8 Events: blood not aspirated, injection not painful, no injection resistance, negative IV test and no paresthesia  Additional Notes Reason for block:procedure for pain

## 2015-04-26 NOTE — Progress Notes (Signed)
Will place orders for admission.

## 2015-04-26 NOTE — MAU Note (Signed)
Pt here with c/o rupture of membranes, contractions.

## 2015-04-26 NOTE — Anesthesia Preprocedure Evaluation (Addendum)
Anesthesia Evaluation  Patient identified by MRN, date of birth, ID band Patient awake    Reviewed: Allergy & Precautions, NPO status , Patient's Chart, lab work & pertinent test results  Airway Mallampati: II  TM Distance: >3 FB Neck ROM: Full    Dental no notable dental hx.    Pulmonary neg pulmonary ROS,    Pulmonary exam normal breath sounds clear to auscultation       Cardiovascular negative cardio ROS Normal cardiovascular exam Rhythm:Regular Rate:Normal     Neuro/Psych negative neurological ROS  negative psych ROS   GI/Hepatic negative GI ROS, Neg liver ROS,   Endo/Other  negative endocrine ROS  Renal/GU negative Renal ROS     Musculoskeletal negative musculoskeletal ROS (+)   Abdominal   Peds  Hematology negative hematology ROS (+)   Anesthesia Other Findings   Reproductive/Obstetrics (+) Pregnancy                             Anesthesia Physical Anesthesia Plan  ASA: II  Anesthesia Plan: Epidural   Post-op Pain Management:    Induction:   Airway Management Planned:   Additional Equipment:   Intra-op Plan:   Post-operative Plan:   Informed Consent: I have reviewed the patients History and Physical, chart, labs and discussed the procedure including the risks, benefits and alternatives for the proposed anesthesia with the patient or authorized representative who has indicated his/her understanding and acceptance.     Plan Discussed with:   Anesthesia Plan Comments:         Anesthesia Quick Evaluation                                   Anesthesia Evaluation  Patient identified by MRN, date of birth, ID band Patient awake    Reviewed: Allergy & Precautions, H&P , NPO status , Patient's Chart, lab work & pertinent test results, reviewed documented beta blocker date and time   History of Anesthesia Complications Negative for: history of anesthetic  complications  Airway Mallampati: I TM Distance: >3 FB Neck ROM: full    Dental  (+) Teeth Intact   Pulmonary neg pulmonary ROS,  breath sounds clear to auscultation        Cardiovascular + Valvular Problems/Murmurs (tricuspid regurg - asymptomatic) Rhythm:regular Rate:Normal + Systolic murmurs    Neuro/Psych negative neurological ROS  negative psych ROS   GI/Hepatic negative GI ROS, Neg liver ROS,   Endo/Other  negative endocrine ROS  Renal/GU negative Renal ROS  negative genitourinary   Musculoskeletal   Abdominal   Peds  Hematology negative hematology ROS (+)   Anesthesia Other Findings   Reproductive/Obstetrics (+) Pregnancy                           Anesthesia Physical Anesthesia Plan  ASA: II  Anesthesia Plan: Epidural   Post-op Pain Management:    Induction:   Airway Management Planned:   Additional Equipment:   Intra-op Plan:   Post-operative Plan:   Informed Consent: I have reviewed the patients History and Physical, chart, labs and discussed the procedure including the risks, benefits and alternatives for the proposed anesthesia with the patient or authorized representative who has indicated his/her understanding and acceptance.     Plan Discussed with:   Anesthesia Plan Comments:  Anesthesia Quick Evaluation  

## 2015-04-26 NOTE — MAU Note (Signed)
Pt states that she noticed leaking around 5pm today and had a large gush of pink/clear fluid around 7pm. Contractions every 3 mins. +FM

## 2015-04-27 ENCOUNTER — Encounter (HOSPITAL_COMMUNITY): Payer: Self-pay | Admitting: *Deleted

## 2015-04-27 LAB — ABO/RH: ABO/RH(D): A POS

## 2015-04-27 LAB — RPR: RPR Ser Ql: NONREACTIVE

## 2015-04-27 MED ORDER — METHYLERGONOVINE MALEATE 0.2 MG/ML IJ SOLN: 0.2000 mg | INTRAMUSCULAR | Status: DC | PRN

## 2015-04-27 MED ORDER — IBUPROFEN 600 MG PO TABS
600.0000 mg | ORAL_TABLET | Freq: Four times a day (QID) | ORAL | Status: DC
  Administered 2015-04-27 – 2015-04-28 (×6): 600 mg via ORAL
  Filled 2015-04-27 (×6): qty 1

## 2015-04-27 MED ORDER — ONDANSETRON HCL 4 MG/2ML IJ SOLN: 4.0000 mg | INTRAMUSCULAR | Status: DC | PRN

## 2015-04-27 MED ORDER — LANOLIN HYDROUS EX OINT: TOPICAL_OINTMENT | CUTANEOUS | Status: DC | PRN

## 2015-04-27 MED ORDER — PRENATAL MULTIVITAMIN CH
1.0000 | ORAL_TABLET | Freq: Every day | ORAL | Status: DC
  Administered 2015-04-27 – 2015-04-28 (×2): 1 via ORAL
  Filled 2015-04-27 (×2): qty 1

## 2015-04-27 MED ORDER — MAGNESIUM HYDROXIDE 400 MG/5ML PO SUSP: 30.0000 mL | ORAL | Status: DC | PRN

## 2015-04-27 MED ORDER — OXYCODONE HCL 5 MG PO TABS: 10.0000 mg | ORAL_TABLET | ORAL | Status: DC | PRN

## 2015-04-27 MED ORDER — ONDANSETRON HCL 4 MG PO TABS: 4.0000 mg | ORAL_TABLET | ORAL | Status: DC | PRN

## 2015-04-27 MED ORDER — TETANUS-DIPHTH-ACELL PERTUSSIS 5-2.5-18.5 LF-MCG/0.5 IM SUSP: 0.5000 mL | Freq: Once | INTRAMUSCULAR | Status: DC

## 2015-04-27 MED ORDER — METHYLERGONOVINE MALEATE 0.2 MG PO TABS: 0.2000 mg | ORAL_TABLET | ORAL | Status: DC | PRN

## 2015-04-27 MED ORDER — MEASLES, MUMPS & RUBELLA VAC ~~LOC~~ INJ
0.5000 mL | INJECTION | Freq: Once | SUBCUTANEOUS | Status: DC
  Filled 2015-04-27: qty 0.5

## 2015-04-27 MED ORDER — DIBUCAINE 1 % RE OINT
1.0000 "application " | TOPICAL_OINTMENT | RECTAL | Status: DC | PRN
  Administered 2015-04-27: 1 via RECTAL
  Filled 2015-04-27: qty 28

## 2015-04-27 MED ORDER — WITCH HAZEL-GLYCERIN EX PADS: 1.0000 "application " | MEDICATED_PAD | CUTANEOUS | Status: DC | PRN

## 2015-04-27 MED ORDER — ZOLPIDEM TARTRATE 5 MG PO TABS: 5.0000 mg | ORAL_TABLET | Freq: Every evening | ORAL | Status: DC | PRN

## 2015-04-27 MED ORDER — SIMETHICONE 80 MG PO CHEW: 80.0000 mg | CHEWABLE_TABLET | ORAL | Status: DC | PRN

## 2015-04-27 MED ORDER — ACETAMINOPHEN 325 MG PO TABS
650.0000 mg | ORAL_TABLET | ORAL | Status: DC | PRN
  Administered 2015-04-27 – 2015-04-28 (×4): 650 mg via ORAL
  Filled 2015-04-27 (×4): qty 2

## 2015-04-27 MED ORDER — SENNOSIDES-DOCUSATE SODIUM 8.6-50 MG PO TABS
2.0000 | ORAL_TABLET | ORAL | Status: DC
  Administered 2015-04-28: 2 via ORAL
  Filled 2015-04-27: qty 2

## 2015-04-27 MED ORDER — OXYCODONE HCL 5 MG PO TABS: 5.0000 mg | ORAL_TABLET | ORAL | Status: DC | PRN

## 2015-04-27 MED ORDER — BENZOCAINE-MENTHOL 20-0.5 % EX AERO
1.0000 "application " | INHALATION_SPRAY | CUTANEOUS | Status: DC | PRN
  Administered 2015-04-27: 1 via TOPICAL
  Filled 2015-04-27: qty 56

## 2015-04-27 MED ORDER — DIPHENHYDRAMINE HCL 25 MG PO CAPS: 25.0000 mg | ORAL_CAPSULE | Freq: Four times a day (QID) | ORAL | Status: DC | PRN

## 2015-04-27 NOTE — Anesthesia Postprocedure Evaluation (Signed)
Anesthesia Post Note  Patient: Amanda Costa  Procedure(s) Performed: * No procedures listed *  Patient location during evaluation: Mother Baby Anesthesia Type: Epidural Level of consciousness: awake and alert and oriented Pain management: satisfactory to patient Vital Signs Assessment: post-procedure vital signs reviewed and stable Respiratory status: spontaneous breathing and nonlabored ventilation Cardiovascular status: stable Postop Assessment: no headache, no backache, no signs of nausea or vomiting, adequate PO intake and patient able to bend at knees (patient up walking) Anesthetic complications: no    Last Vitals:  Filed Vitals:   04/27/15 0441 04/27/15 0615  BP: 112/56 106/56  Pulse: 90 84  Temp: 36.9 C 36.8 C  Resp: 18 18    Last Pain:  Filed Vitals:   04/27/15 0643  PainSc: 0-No pain                 Danis Pembleton

## 2015-04-27 NOTE — Consults (Signed)
  Anesthesia Pain Consult Note  Patient: Amanda Costa, 35 y.o., female  Consult Requested by: Cheri Fowler, MD  Reason CRNA Pain Management Round  Level of Consciousness: alert  Pain 2

## 2015-04-27 NOTE — H&P (Signed)
Amanda Costa is a 35 y.o. female, G3 P1011, EGA [redacted] weeks with EDC 3-16 presenting for leaking fluid and ctx.  Eval in MAU confirmed ROM, irregular ctx, VE 5 cm.  PNC essentially uncomplicated.  Maternal Medical History:  Reason for admission: Rupture of membranes and contractions.   Contractions: Frequency: irregular.   Perceived severity is moderate.    Fetal activity: Perceived fetal activity is normal.    Prenatal complications: no prenatal complications   OB History    Gravida Para Term Preterm AB TAB SAB Ectopic Multiple Living   3 1 1  0 1 0 1 0 0 1     Past Medical History  Diagnosis Date  . Tricuspid regurgitation   . Cystic fibrosis carrier   . Left breast lump     1-2 cm knot L axilla during end of pregnancy   Past Surgical History  Procedure Laterality Date  . Wisdom tooth extraction     Family History: family history includes Alcohol abuse in her paternal aunt, paternal grandfather, paternal grandmother, and paternal uncle; Arthritis in her maternal grandmother and mother; Asthma in her mother; Cancer in her paternal grandmother; Diabetes in her father and paternal grandmother; Hearing loss in her maternal uncle; Heart disease in her maternal uncle and mother; Hypertension in her mother; Hypothyroidism in her mother. Social History:  reports that she has never smoked. She has never used smokeless tobacco. She reports that she does not drink alcohol or use illicit drugs.   Prenatal Transfer Tool  Maternal Diabetes: No Genetic Screening: Normal Maternal Ultrasounds/Referrals: Normal Fetal Ultrasounds or other Referrals:  None Maternal Substance Abuse:  No Significant Maternal Medications:  None Significant Maternal Lab Results:  Lab values include: Group B Strep negative Other Comments:  None  Review of Systems  Respiratory: Negative.   Cardiovascular: Negative.     Dilation: 10 Effacement (%): 90 Station: 0 Exam by:: Cheri Fowler MD Blood pressure  114/55, pulse 72, temperature 98.2 F (36.8 C), temperature source Oral, resp. rate 18, height 5' 4.5" (1.638 m), weight 73.664 kg (162 lb 6.4 oz), SpO2 95 %. Maternal Exam:  Uterine Assessment: Contraction strength is moderate.  Contraction frequency is regular.   Abdomen: Patient reports no abdominal tenderness. Estimated fetal weight is 7 lbs.   Fetal presentation: vertex  Introitus: Normal vulva. Normal vagina.  Ferning test: positive.  Amniotic fluid character: clear.  Pelvis: adequate for delivery.   Cervix: Cervix evaluated by digital exam.     Physical Exam  Vitals reviewed. Constitutional: She appears well-developed and well-nourished.  Cardiovascular: Normal rate, regular rhythm and normal heart sounds.   No murmur heard. Respiratory: Effort normal and breath sounds normal. No respiratory distress. She has no wheezes.  GI: Soft.    Prenatal labs: ABO, Rh: --/--/A POS (02/22 2100) Antibody: NEG (02/22 2100) Rubella: Immune (08/17 0000) RPR: Non Reactive (02/22 2100)  HBsAg: Negative (08/17 0000)  HIV: Non-reactive (08/17 0000)  GBS: Negative (02/11 0000)   Assessment/Plan: IUP at 37 weeks with PPROM  At [redacted]W[redacted]D admitted and started on pitocin augmentation.  She received an epidural and progressed well, see delivery note.   Newt Levingston D 04/27/2015, 3:39 AM

## 2015-04-27 NOTE — Lactation Note (Signed)
This note was copied from a baby's chart. Lactation Consultation Note  Patient Name: Amanda Costa [id number] Date: 04/27/2015 Reason for consult: Initial assessment Baby at 37 hr of life and mom reports bf is going well. Stated that her breast are getting sore from all the hand expression to get baby interested in the breast. Denied nipple pain, voiced no concerns. She bf her older child 14 months with no problems. Discussed baby behavior/LPT infant guidelines, feeding frequency, baby belly size, voids, wt loss, breast changes, and nipple care. Given lactation and LPT infant handouts. Aware of OP services and support group.     Maternal Data Has patient been taught Hand Expression?: Yes Does the patient have breastfeeding experience prior to this delivery?: Yes  Feeding    LATCH Score/Interventions                      Lactation Tools Discussed/Used WIC Program: No   Consult Status Consult Status: Follow-up Date: 04/28/15 Follow-up type: In-patient    Denzil Hughes 04/27/2015, 9:47 PM

## 2015-04-28 ENCOUNTER — Ambulatory Visit: Payer: Self-pay

## 2015-04-28 MED ORDER — IBUPROFEN 600 MG PO TABS: 600.0000 mg | ORAL_TABLET | Freq: Four times a day (QID) | ORAL | Status: DC

## 2015-04-28 NOTE — Progress Notes (Signed)
PPD #1 No problems, wants to go home Afeb, VSS Fundus firm, NT at U-1 Continue routine postpartum care, d/c home 

## 2015-04-28 NOTE — Discharge Summary (Signed)
OB Discharge Summary     Patient Name: Amanda Costa DOB: 08-24-1980 MRN: [id number]  Date of admission: 04/26/2015 Delivering MD: Willis Modena, Zeina Akkerman   Date of discharge: 04/28/2015  Admitting diagnosis: 36.6w waterbroke Intrauterine pregnancy: [redacted]w[redacted]d     Secondary diagnosis:  Active Problems:   Preterm premature rupture of membranes (PPROM) with onset of labor within 24 hours of rupture in third trimester, antepartum   SVD (spontaneous vaginal delivery)      Discharge diagnosis: Term Pregnancy Delivered                                                                                                 Augmentation: Pitocin  Complications: None  Hospital course:  Onset of Labor With Vaginal Delivery     35 y.o. yo [id number] at [redacted]w[redacted]d was admitted in Latent Labor on 04/26/2015. Patient had an uncomplicated labor course as follows:  Membrane Rupture Time/Date: 5:00 PM ,04/26/2015   Intrapartum Procedures: Episiotomy: None [1]                                         Lacerations:  2nd degree [3]  Patient had a delivery of a Viable infant. 04/27/2015  Information for the patient's newborn:  Amanda, Costa [id number]  Delivery Method: Vaginal, Spontaneous Delivery (Filed from Delivery Summary)    Pateint had an uncomplicated postpartum course.  She is ambulating, tolerating a regular diet, passing flatus, and urinating well. Patient is discharged home in stable condition on 04/28/2015.    Physical exam  Filed Vitals:   04/27/15 0615 04/27/15 1005 04/27/15 1800 04/28/15 0625  BP: 106/56 107/55 113/55 102/51  Pulse: 84 71 71 57  Temp: 98.2 F (36.8 C) 98 F (36.7 C) 98 F (36.7 C) 97.6 F (36.4 C)  TempSrc:  Oral Oral   Resp: 18 18 18 18   Height:      Weight:      SpO2: 98% 99% 100%    General: alert Lochia: appropriate Uterine Fundus: firm  Labs: Lab Results  Component Value Date   WBC 12.5* 04/26/2015   HGB 11.6* 04/26/2015   HCT 33.2* 04/26/2015   MCV 90.7  04/26/2015   PLT 178 04/26/2015   CMP Latest Ref Rng 03/21/2015  Glucose 70 - 99 mg/dL 85  BUN 6 - 23 mg/dL 4(L)  Creatinine 0.40 - 1.20 mg/dL 0.48  Sodium 135 - 145 mEq/L 135  Potassium 3.5 - 5.1 mEq/L 3.9  Chloride 96 - 112 mEq/L 104  CO2 19 - 32 mEq/L 24  Calcium 8.4 - 10.5 mg/dL 8.2(L)  Total Protein 6.0 - 8.3 g/dL 6.7  Total Bilirubin 0.2 - 1.2 mg/dL 0.4  Alkaline Phos 39 - 117 U/L 91  AST 0 - 37 U/L 16  ALT 0 - 35 U/L 14    Discharge instruction: per After Visit Summary and "Baby and Me Booklet".  After visit meds:    Medication List    STOP taking these medications  cefdinir 300 MG capsule  Commonly known as:  OMNICEF      TAKE these medications        calcium carbonate 500 MG chewable tablet  Commonly known as:  TUMS - dosed in mg elemental calcium  Chew 2-3 tablets by mouth daily as needed for indigestion or heartburn.     ibuprofen 600 MG tablet  Commonly known as:  ADVIL,MOTRIN  Take 1 tablet (600 mg total) by mouth every 6 (six) hours.     prenatal multivitamin Tabs tablet  Take 1 tablet by mouth daily at 12 noon.        Diet: routine diet  Activity: Advance as tolerated. Pelvic rest for 6 weeks.   Outpatient follow up:6 weeks   Newborn Data: Live born female  Birth Weight: 7 lb 9.7 oz (3450 g) APGAR: 9, 9  Baby Feeding: Breast Disposition:home with mother   04/28/2015 Amanda Duke, MD

## 2015-04-28 NOTE — Discharge Instructions (Signed)
As per discharge pamphlet °

## 2015-04-28 NOTE — Lactation Note (Addendum)
This note was copied from a baby's chart. Lactation Consultation Note  Patient Name: Amanda Costa [id number] Date: 04/28/2015 Reason for consult: Follow-up assessment (early term at 37.0 wks.) Mom concerned because baby has been very sleepy at the breast since birth. Baby was also circumcised this am. Mom has started pumping and spoon fed baby 4 ml of colostrum this am. Mom now has 10 ml of colostrum available. Mom is experienced BF but not with early term baby. Reviewed early term behaviors with Mom. Baby giving feeding ques, LC assisted Mom with using breast compression so baby could obtain/sustain good depth. Mom has large nipples and it takes baby few attempts to organize his suck. Demonstrated to Mom how to supplement her EBM at breast using 5 fr feeding tube. This took few attempts but once baby developed a good suckling pattern he did well taking 5 of the 10 ml of colostrum. After taking 5 ml at the breast, baby came off the breast. Mom re-latched baby and he was able to sustain a nutritive suckling pattern with some swallows noted. Attempted to give baby the remainder of the 5 ml of colostrum via foley cup to demonstrate to Mom but baby fussy and would not take the colostrum. Mom encouraged that baby sustained the latch and more nutritive suckling pattern this feeding. Advised baby should be at the breast 8-12 times and with feeding ques in 24 hours. Keep baby nursing for 15-20 minutes, both breasts when possible. Mom has DEBP and encouraged to post pump after feedings for 15 minutes during the day and give baby back EBM received especially if baby not waking to BF or sustaining the latch. Mom to call for assist with another feeding today, hoping for d/c this afternoon.  BF history: BF X 5 for 10-15 minutes on average, plus an additional 4 attempts.  Baby not 69 hours old.  3 voids/2 stools in 24 hours, 4 voids/2 stools in life. Bili 6.2 at 29 hours, low intermediate risk. Maternal Data     Feeding Feeding Type: Breast Fed Length of feed: 30 min  LATCH Score/Interventions Latch: Repeated attempts needed to sustain latch, nipple held in mouth throughout feeding, stimulation needed to elicit sucking reflex. Intervention(s): Adjust position;Assist with latch;Breast massage;Breast compression  Audible Swallowing: Spontaneous and intermittent  Type of Nipple: Everted at rest and after stimulation  Comfort (Breast/Nipple): Soft / non-tender     Hold (Positioning): Assistance needed to correctly position infant at breast and maintain latch. Intervention(s): Breastfeeding basics reviewed;Support Pillows;Position options;Skin to skin  LATCH Score: 8  Lactation Tools Discussed/Used Tools: 24F feeding tube / Syringe;Pump Breast pump type: Double-Electric Breast Pump Pump Review: Setup, frequency, and cleaning;Milk Storage Initiated by:: Kalman Jewels, RN Date initiated:: 04/28/15   Consult Status Consult Status: Follow-up Date: 04/28/15 Follow-up type: In-patient    Katrine Coho 04/28/2015, 11:41 AM

## 2015-04-28 NOTE — Lactation Note (Signed)
This note was copied from a baby's chart. Lactation Consultation Note  Patient Name: Amanda Costa [id number] Date: 04/28/2015 Reason for consult: Follow-up assessment;Other (Comment) (early term baby 37.0 wks.) Mom latching baby to left breast when [place] arrived. Baby had just BF on right breast. LC assisted Mom with breast compression to help baby obtain/sustain good depth with latch. Reviewed techniques with parents of how to keep baby awake at breast. Baby demonstrated good suckling bursts with some swallows noted. Reviewed teaching advising Mom baby should be at breast 8-12 times in 24 hours and with feeding ques. If baby does not give feeding ques by 3 hours from last feeding, place baby STS and see if he will BF. Mom to post pump during the day after feedings for 15 minutes to encourage milk production and to have EBM to supplement till milks comes in and baby BF consistently. Baby appears interested and will sustain a good suckling pattern when Mom keeps baby close to breast. Reviewed positioning. Advised to refer to Baby N Me booklet page 25 for breast milk storage guidelines. Monitor void/stools. Encouraged to call for questions/concerns.   Maternal Data    Feeding Feeding Type: Breast Fed Length of feed: 10 min  LATCH Score/Interventions Latch: Grasps breast easily, tongue down, lips flanged, rhythmical sucking. Intervention(s): Assist with latch;Breast compression  Audible Swallowing: A few with stimulation  Type of Nipple: Everted at rest and after stimulation  Comfort (Breast/Nipple): Soft / non-tender     Hold (Positioning): Assistance needed to correctly position infant at breast and maintain latch.  LATCH Score: 8  Lactation Tools Discussed/Used Tools: 48F feeding tube / Syringe;Feeding cup;Pump Breast pump type: Double-Electric Breast Pump   Consult Status Consult Status: Complete Date: 04/28/15 Follow-up type: In-patient    Katrine Coho 04/28/2015,  2:53 PM

## 2015-05-02 ENCOUNTER — Telehealth (HOSPITAL_COMMUNITY): Payer: Self-pay | Admitting: Lactation Services

## 2015-05-02 NOTE — Telephone Encounter (Signed)
Amanda Costa now 31 days old. [redacted] week gestation at delivery. Mom called reporting Amanda Amanda Costa has become fussy at the breast, having difficulty sustaining the latch, pulling on/off the breast, biting. Amanda Costa had been cluster feeding prior to this, went to Peds yesterday and has gained weight. Mom's milk is in, she c/o of sore nipples. Last good feeding was midnight and since that time he has not been sustaining the latch well. Mom reports changing positions has not helped. She has pre-pumped about 30 minutes before trying to latch and this has not helped either. Mom reports Amanda seems to be gassy. LC advised Mom to pre-pump thru 1st milk ejection and immediately latch Amanda to see if this will make breast more compressible and control the flow of milk for Amanda Costa now that her milk has come in. Good massage throughout the feeding to empty breast well. If [employer] will not sustain the latch, fussy and will not latch after 5-10 minutes of trying, advised to give him an appetizer of 5-10 ml of EBM and then try to latch once he has settled down. FOB can give appetizer while Mom pre-pumps. If Amanda Amanda Costa continues to be fussy and not latching then Mom needs to pump and feed Amanda, if using bottle advised to use Dr. Owens Shark #1. Try breast again with next feeding.  Discussed importance of Amanda not being frustrated at breast, Mom protecting milk supply and Amanda continuing to be fed. If not going to breast advised 45-60 ml of EBM with feedings. Mom reports she can pump at least 60 ml now that her milk is in.  Advised to apply EBM to sore nipples, air dry.  Encouraged support group today, 1st available OP appointment Friday 05/05/15 at 2:30. Mom scheduled this appointment. Encouraged Mom to keep this appointment since she has had sore nipples for few days. LC needs to assess latch. Discussed gas producing foods/diet, advised to keep Amanda upright for 20 minutes after feeding, burp well.

## 2015-05-05 ENCOUNTER — Ambulatory Visit (HOSPITAL_COMMUNITY)
Admission: RE | Admit: 2015-05-05 | Discharge: 2015-05-05 | Disposition: A | Discharge status: Home / Self Care | Payer: 59 | Source: Ambulatory Visit | Attending: Obstetrics and Gynecology | Admitting: Obstetrics and Gynecology

## 2015-05-08 NOTE — Lactation Note (Signed)
Lactation Consult  Mother's reason for visit:  Feeding problems Visit Type: Feeding assessment Appointment Notes:  Consult:  Initial Lactation Consultant:  Ave Filter  ________________________________________________________________________   6 Name: Amanda Costa Date of Birth: 04/27/2014 Pediatrician: Janann Colonel Gender: female Gestational Age: [redacted]w[redacted]d (At Birth) Birth Weight: 7 lb 9.7 oz (3450 g) Weight at Discharge: Weight: 7 lb 6.3 oz (3355 g)Date of Discharge: 04/28/2015 [facility] Weights   04/27/15 0315 04/28/15 0011  Weight: 7 lb 9.7 oz (3450 g) 7 lb 6.3 oz (3355 g)   Last weight taken from location outside of [facility]: 7-0.4 on 05/04/15 Location:[facility] Weight today: 7-1.3    ________________________________________________________________________  Mother's Name: Amanda Costa Type of delivery:  vaginal Breastfeeding Experience: 13 months with first baby    ________________________________________________________________________  Breastfeeding History (Post Discharge)  Frequency of breastfeeding:  8 times per day Duration of feeding:  20 minutes each side  Supplementation     Breastmilk:  Volume 30-73ml Frequency:  4-5 times per day  Method:  Bottle,   Pumping  Type of pump:  Medela pump in style Frequency:  8 times per day Volume:  90-139ml  Infant Intake and Output Assessment  Voids:  7 in 24 hrs.  Color:  Clear yellow Stools:  5-7 in 24 hrs.  Color:  Yellow  ________________________________________________________________________  Maternal Breast Assessment  Breast:  Full Nipple:  Erect, large    _______________________________________________________________________ Feeding Assessment/Evaluation  Mom and 58 day old infant here for feeding assessment.  Mom breastfed her first baby for 13 months without difficulty.  Newborn was delivered at 90 weeks.  She is concerned that baby is so sleepy at  breast and she needs to post pump for comfort and supplement with a bottle most feeds.  Discussion with parents the differences with a term vs late preterm feeding behavior.  Reassured that this is normal and as baby matures feedings should become more effective.  Stressed importance of post pumping to maintain milk supply and feeding baby enough calories to gain at least one ounce per day.  Parents voice understanding.  Observed mom easily latch baby to breast using cross cradle hold.  Mom has large nipples but baby is able to accommodate nipple and obtain deep latch.  Baby nurses actively with stimulation and breast massage needed.  Baby suckled for 30 minutes and then became sleepy.  Milk transfer 34 mls.  FOB supplemented baby with 40 mls of expressed milk.  Plan is to feed Amanda Costa on cue but at least every 3 hours, post pump at least 6 times per day and give 30-19mls of expressed milk after each feeding.  Follow up appointment 05/12/15  Initial feeding assessment:  Infant's oral assessment:  WNL  Positioning:  Cross cradle Left breast  LATCH documentation:  Latch:  2 = Grasps breast easily, tongue down, lips flanged, rhythmical sucking.  Audible swallowing:  2 = Spontaneous and intermittent  Type of nipple:  2 = Everted at rest and after stimulation  Comfort (Breast/Nipple):  2 = Soft / non-tender  Hold (Positioning):  2 = No assistance needed to correctly position infant at breast  LATCH score:  10  Attached assessment:  Deep  Lips flanged:  Yes.    Lips untucked:  No.  Suck assessment:  Displays both     Pre-feed weight:  3212g  Post-feed weight:  3246 g  Amount transferred:  34 ml Amount supplemented:  40 ml

## 2015-05-12 ENCOUNTER — Ambulatory Visit (HOSPITAL_COMMUNITY)
Admission: RE | Admit: 2015-05-12 | Discharge: 2015-05-12 | Disposition: A | Discharge status: Home / Self Care | Payer: 59 | Source: Ambulatory Visit | Attending: Obstetrics and Gynecology | Admitting: Obstetrics and Gynecology

## 2015-05-12 NOTE — Lactation Note (Signed)
Lactation Consult  Mother's reason for visit:  Follow up feeding assessment, baby born at 61 weeks Visit Type:  Outpatient Consult:  Follow-Up Lactation Consultant:  Amanda Costa  ________________________________________________________________________  Amanda Costa Name: Amanda Costa Date of Birth: 04/27/2014 Pediatrician: Dr. Rosalyn Charters Gender: female Gestational Age: [redacted]w[redacted]d (At Birth) Birth Weight: 7 lb 9.7 oz (3450 g) Weight at Discharge: Weight: 7 lb 6.3 oz (3355 g)Date of Discharge: 04/28/2015 [facility]   04/27/15 0315 04/28/15 0011  Weight: 7 lb 9.7 oz (3450 g) 7 lb 6.3 oz (3355 g)   Last weight taken from location outside of [facility]: 7 lbs 4 oz 05/08/15  Weight today: 7 lbs 12.4 oz   Mom reports baby had a difficult time for 2 days this week, latching to the breast.  Prior to this she was breast feeding using a nipple shield, pumping and following with 1-2 oz of breast milk by bottle.  For 2 days this week, baby only took bottles of EBM, he would not latch.  Starting yesterday, baby latched back on breast and fed well without use of a nipple shield.  No supplement given, and pumping decreased to 2-4 times a day after feeding.  Baby's output continued to be good, and breasts softened well during feedings, with multiple swallows heard.  Baby contented post feeding. This morning, baby had just been fed for about 60 minutes on both breasts, finishing up 30 minutes prior to appointment.  Baby asleep in his car seat.  Decided to go ahead and weight baby and see if he will latch to the breast anyway.  Baby has gained 11 oz in 7 days.  Mom latched baby in cross cradle hold.  Adjusted hand placement on breast to facilitate a deeper latch onto areola.  Mom has large diameter, and long nipples, and baby latches onto nipples and Mom adjusts baby's mouth.  Tips given to avoid baby suckling on nipple.  Baby fed on and off for about 30 minutes, not seeming  very hungry.  His sucking pattern was good for short periods, but he would come off contented.  Post weight gain was 44 ml!  We didn't expect such a good total and Mom very relieved.   Plan is to feed on cue, skin to skin, and post pump 2-4 times/day for comfort and to support her great milk supply.  To try to improve baby's latch by compressing and sandwiching her breast in a U hold when using cross cradle hold.  Mom to try to avoid bottles in next 5 days, and return 05/17/15 for a full feeding assessment.    ________________________________________________________________________ Mother's Name: Amanda Costa Type of delivery:  vaginal Breastfeeding Experience:  13 months with first child ______________________________________________________________________ Breastfeeding History (Post Discharge) Frequency of breastfeeding:  On cue >8 times a day Duration of feeding:  30-60 mins Pumping Type of pump:  Medela pump in style Frequency:  After breast feeding 6 times a day Volume:  3-5 oz Infant Intake and Output Assessment Voids: 8 in 24 hrs.  Color:  Clear yellow Stools:  8 in 24 hrs.  Color:  Yellow _______________________________________________________________________ Maternal Breast Assessment Breast:  Soft Nipple:  Erect Pain level:  0 _____________________________________________________________________ Feeding Assessment/Evaluation Initial feeding assessment: Infant's oral assessment:  WNL Positioning:  Cross cradle Right breast LATCH documentation:  Latch:  2 = Grasps breast easily, tongue down, lips flanged, rhythmical sucking.  Audible swallowing:  2 = Spontaneous and intermittent  Type of nipple:  2 =  Everted at rest and after stimulation  Comfort (Breast/Nipple):  2 = Soft / non-tender  Hold (Positioning):  1 = Assistance needed to correctly position infant at breast and maintain latch  LATCH score:  9 Attached assessment:  Deep after some adjustments in breast  support  Lips flanged:  No.  Lips untucked:  Yes.   Suck assessment:  Displays both Pre-feed weight:  3526 g  Post-feed weight:  3570 g  Amount transferred: 44 ml  Total amount transferred:  44 ml

## 2015-05-17 ENCOUNTER — Ambulatory Visit (HOSPITAL_COMMUNITY): Payer: 59

## 2015-06-23 ENCOUNTER — Encounter (HOSPITAL_COMMUNITY): Payer: Self-pay | Admitting: *Deleted

## 2015-06-24 ENCOUNTER — Encounter (HOSPITAL_COMMUNITY): Payer: Self-pay | Admitting: Anesthesiology

## 2015-06-29 ENCOUNTER — Ambulatory Visit (HOSPITAL_COMMUNITY): Admission: RE | Admit: 2015-06-29 | Payer: 59 | Source: Ambulatory Visit | Admitting: Obstetrics and Gynecology

## 2015-06-29 SURGERY — DILATION AND EVACUATION, UTERUS
Anesthesia: Choice

## 2015-06-29 MED ORDER — LIDOCAINE HCL 2 % IJ SOLN
INTRAMUSCULAR | Status: AC
  Filled 2015-06-29: qty 20

## 2015-06-29 MED FILL — METHERGINE 0.2 MG TABLET: 0.2 | 2 days supply | Qty: 6 | Fill #0

## 2016-02-27 DIAGNOSIS — H5213 Myopia, bilateral: Secondary | ICD-10-CM | POA: Diagnosis not present

## 2016-02-27 MED FILL — PREDNISOLONE AC 1% EYE DROP: 1 | 10 days supply | Qty: 5 | Fill #0

## 2016-04-11 ENCOUNTER — Encounter: Payer: Self-pay | Admitting: Family Medicine

## 2016-04-11 ENCOUNTER — Ambulatory Visit (INDEPENDENT_AMBULATORY_CARE_PROVIDER_SITE_OTHER): Payer: 59 | Admitting: Family Medicine

## 2016-04-11 VITALS — BP 108/70 | HR 77 | Temp 98.2°F | Ht 63.39 in | Wt 122.6 lb

## 2016-04-11 DIAGNOSIS — Z8249 Family history of ischemic heart disease and other diseases of the circulatory system: Secondary | ICD-10-CM | POA: Diagnosis not present

## 2016-04-11 DIAGNOSIS — R2232 Localized swelling, mass and lump, left upper limb: Secondary | ICD-10-CM | POA: Diagnosis not present

## 2016-04-11 DIAGNOSIS — H02729 Madarosis of unspecified eye, unspecified eyelid and periocular area: Secondary | ICD-10-CM

## 2016-04-11 DIAGNOSIS — H00012 Hordeolum externum right lower eyelid: Secondary | ICD-10-CM

## 2016-04-11 LAB — COMPREHENSIVE METABOLIC PANEL
ALT: 16 U/L (ref 0–35)
AST: 17 U/L (ref 0–37)
Albumin: 4.4 g/dL (ref 3.5–5.2)
Alkaline Phosphatase: 61 U/L (ref 39–117)
BUN: 13 mg/dL (ref 6–23)
CO2: 31 mEq/L (ref 19–32)
Calcium: 9.6 mg/dL (ref 8.4–10.5)
Chloride: 103 mEq/L (ref 96–112)
Creatinine, Ser: 0.66 mg/dL (ref 0.40–1.20)
GFR: 108.7 mL/min (ref >60.00)
Glucose, Bld: 86 mg/dL (ref 70–99)
Potassium: 4 mEq/L (ref 3.5–5.1)
Sodium: 139 mEq/L (ref 135–145)
Total Bilirubin: 0.4 mg/dL (ref 0.2–1.2)
Total Protein: 7.6 g/dL (ref 6.0–8.3)

## 2016-04-11 LAB — LIPID PANEL
Cholesterol: 154 mg/dL (ref 0–200)
HDL: 61.1 mg/dL (ref >39.00)
LDL Cholesterol: 86 mg/dL (ref 0–99)
NonHDL: 92.95
Total CHOL/HDL Ratio: 3
Triglycerides: 34 mg/dL (ref 0.0–149.0)
VLDL: 6.8 mg/dL (ref 0.0–40.0)

## 2016-04-11 LAB — CBC WITH DIFFERENTIAL/PLATELET
Basophils Absolute: 0 10*3/uL (ref 0.0–0.1)
Basophils Relative: 0.6 % (ref 0.0–3.0)
Eosinophils Absolute: 0.2 10*3/uL (ref 0.0–0.7)
Eosinophils Relative: 3.4 % (ref 0.0–5.0)
HCT: 40.9 % (ref 36.0–46.0)
Hemoglobin: 13.6 g/dL (ref 12.0–15.0)
Lymphocytes Relative: 34.7 % (ref 12.0–46.0)
Lymphs Abs: 2.1 10*3/uL (ref 0.7–4.0)
MCHC: 33.3 g/dL (ref 30.0–36.0)
MCV: 90.3 fl (ref 78.0–100.0)
Monocytes Absolute: 0.5 10*3/uL (ref 0.1–1.0)
Monocytes Relative: 8.2 % (ref 3.0–12.0)
Neutro Abs: 3.2 10*3/uL (ref 1.4–7.7)
Neutrophils Relative %: 53.1 % (ref 43.0–77.0)
Platelets: 220 10*3/uL (ref 150.0–400.0)
RBC: 4.53 Mil/uL (ref 3.87–5.11)
RDW: 13.1 % (ref 11.5–15.5)
WBC: 6.1 10*3/uL (ref 4.0–10.5)

## 2016-04-11 MED ORDER — ERYTHROMYCIN 5 MG/GM OP OINT: TOPICAL_OINTMENT | Freq: Four times a day (QID) | OPHTHALMIC | Status: DC

## 2016-04-11 MED ORDER — BIMATOPROST 0.03 % EX SOLN: CUTANEOUS | 12 refills | Status: DC

## 2016-04-11 MED ORDER — AMOXICILLIN 875 MG PO TABS: 875.0000 mg | ORAL_TABLET | Freq: Two times a day (BID) | ORAL | 0 refills | Status: DC

## 2016-04-11 NOTE — Progress Notes (Signed)
Pre visit review using our clinic review tool, if applicable. No additional management support is needed unless otherwise documented below in the visit note. 

## 2016-04-12 DIAGNOSIS — R2232 Localized swelling, mass and lump, left upper limb: Secondary | ICD-10-CM | POA: Insufficient documentation

## 2016-04-12 DIAGNOSIS — H02729 Madarosis of unspecified eye, unspecified eyelid and periocular area: Secondary | ICD-10-CM | POA: Insufficient documentation

## 2016-04-12 MED ORDER — ERYTHROMYCIN 5 MG/GM OP OINT: TOPICAL_OINTMENT | OPHTHALMIC | 0 refills | Status: DC

## 2016-04-12 NOTE — Progress Notes (Signed)
Amanda Costa is a 36 y.o. female is here to [facility].   History of Present Illness:    1. Mass of left axilla: Left. Noticed after breastfeeding first son. US showed that it was likely a clogged milk duct. Has been monitored since. No change in size. Currently, breastfeeding second son with plans to wean in the next few months. No skin changes. No FamHx of breast cancer.   2. Hypotrichosis of eyelid, unspecified laterality: Interested in [place].   3. Family history of early CAD: Mother and Uncle. Hx of ECHO many years ago. Essentially normal - trivial tricuspid regurgitation. No issues when exercising. No issues when pregnant. Normal weight. Nonsmoker. Generally, eats healthy foods.    4. Hordeolum externum of right lower eyelid: Started a few days ago. Right lower eye. Painful. Red. Using warm compresses. No vision changes.    PMHx, SurgHx, SocialHx, Medications, and Allergies were reviewed in the Visit Navigator and updated as appropriate.    Past Medical History:  Diagnosis Date  . Cystic fibrosis carrier   . Left breast lump    1-2 cm knot L axilla during end of pregnancy  . SVD (spontaneous vaginal delivery)    x 2  . Tricuspid regurgitation     Past Surgical History:  Procedure Laterality Date  . LASIK Bilateral   . WISDOM TOOTH EXTRACTION      Family History  Problem Relation Age of Onset  . Arthritis Mother   . Asthma Mother   . Hypertension Mother   . Hypothyroidism Mother   . Heart disease Mother     early CAD  . Diabetes Father   . Heart disease Maternal Uncle   . Hearing loss Maternal Uncle   . Alcohol abuse Paternal Aunt   . Alcohol abuse Paternal Uncle   . Arthritis Maternal Grandmother   . Alcohol abuse Paternal Grandmother   . Diabetes Paternal Grandmother   . Cancer Paternal Grandmother     skin  . Alcohol abuse Paternal Grandfather     Social History  Substance Use Topics  . Smoking status: Never Smoker  . Smokeless tobacco:  Never Used  . Alcohol use No     Current Medications and Allergies:    Current Outpatient Prescriptions:  .  Calcium-Magnesium-Vitamin D (CALCIUM 500 PO), Take 500 mg by mouth daily., Disp: , Rfl:  .  Cholecalciferol 2000 units TABS, Take 2,000 Units by mouth daily., Disp: , Rfl:  .  Omega-3 Fatty Acids (FISH OIL) 1200 MG CAPS, Take 1,200 mg by mouth daily., Disp: , Rfl:  .  Prenatal Vit-Fe Fumarate-FA (PRENATAL MULTIVITAMIN) TABS, Take 1 tablet by mouth daily at 12 noon., Disp: , Rfl:   No Known Allergies    Patient Information Form: Screening and ROS    Do you feel safe in relationships? yes PHQ-2: negative  Review of Systems  General:  Negative for nexplained weight loss, fever Skin: Negative for new or changing mole, sore that won't heal HEENT: Negative for trouble hearing, trouble seeing, ringing in ears, mouth sores, hoarseness, change in voice, dysphagia CV:  Negative for chest pain, dyspnea, edema, palpitations Resp: Negative for cough, dyspnea, hemoptysis GI: Negative for nausea, vomiting, diarrhea, constipation, abdominal pain, melena, hematochezia GU: Negative for dysuria, incontinence, urinary hesitance, hematuria, vaginal or penile discharge, polyuria, sexual difficulty, lumps in testicle or breasts MSK: Negative for muscle cramps or aches, joint pain or swelling Neuro: Negative for headaches, weakness, numbness, dizziness, passing out/fainting Psych: Negative for depression,  anxiety, memory problems   Vitals:   Vitals:   04/11/16 0828  BP: 108/70  Pulse: 77  Temp: 98.2 F (36.8 C)  TempSrc: Oral  SpO2: 99%  Weight: 122 lb 9.6 oz (55.6 kg)  Height: 5' 3.39" (1.61 m)     Body mass index is 21.45 kg/m.   Physical Exam:    General: Alert, cooperative, appears stated age and no distress.  HEENT:  Normocephalic, without obvious abnormality, atraumatic. Conjunctivae/corneas clear. Stye on left lower lid. PERRL, EOM's intact. Normal TM's and external  ear canals both ears. Nares normal. Septum midline. Mucosa normal. No drainage or sinus tenderness. Lips, mucosa, and tongue normal; teeth and gums normal.  Lungs: Clear to auscultation bilaterally.  Heart:: Regular rate and rhythm, S1, S2 normal, no murmur, click, rub or gallop.  Abdomen: Soft, non-tender; bowel sounds normal; no masses,  no organomegaly.  Extremities: Extremities normal, atraumatic, no cyanosis or edema.  Pulses: 2+ and symmetric.  Skin: Skin color, texture, turgor normal. No rashes or lesions.  Neurologic: Alert and oriented X 3, normal strength and tone. Normal symmetric. reflexes. Normal coordination and gait.  Psych: Alert,oriented, in NAD with a full range of affect, normal behavior and no psychotic features    Results for orders placed or performed in visit on 04/11/16  CBC with Differential/Platelet  Result Value Ref Range   WBC 6.1 4.0 - 10.5 K/uL   RBC 4.53 3.87 - 5.11 Mil/uL   Hemoglobin 13.6 12.0 - 15.0 g/dL   HCT 40.9 36.0 - 46.0 %   MCV 90.3 78.0 - 100.0 fl   MCHC 33.3 30.0 - 36.0 g/dL   RDW 13.1 11.5 - 15.5 %   Platelets 220.0 150.0 - 400.0 K/uL   Neutrophils Relative % 53.1 43.0 - 77.0 %   Lymphocytes Relative 34.7 12.0 - 46.0 %   Monocytes Relative 8.2 3.0 - 12.0 %   Eosinophils Relative 3.4 0.0 - 5.0 %   Basophils Relative 0.6 0.0 - 3.0 %   Neutro Abs 3.2 1.4 - 7.7 K/uL   Lymphs Abs 2.1 0.7 - 4.0 K/uL   Monocytes Absolute 0.5 0.1 - 1.0 K/uL   Eosinophils Absolute 0.2 0.0 - 0.7 K/uL   Basophils Absolute 0.0 0.0 - 0.1 K/uL  Comprehensive metabolic panel  Result Value Ref Range   Sodium 139 135 - 145 mEq/L   Potassium 4.0 3.5 - 5.1 mEq/L   Chloride 103 96 - 112 mEq/L   CO2 31 19 - 32 mEq/L   Glucose, Bld 86 70 - 99 mg/dL   BUN 13 6 - 23 mg/dL   Creatinine, Ser 0.66 0.40 - 1.20 mg/dL   Total Bilirubin 0.4 0.2 - 1.2 mg/dL   Alkaline Phosphatase 61 39 - 117 U/L   AST 17 0 - 37 U/L   ALT 16 0 - 35 U/L   Total Protein 7.6 6.0 - 8.3 g/dL    Albumin 4.4 3.5 - 5.2 g/dL   Calcium 9.6 8.4 - 10.5 mg/dL   GFR 108.70 >60.00 mL/min  Lipid panel  Result Value Ref Range   Cholesterol 154 0 - 200 mg/dL   Triglycerides 34.0 0.0 - 149.0 mg/dL   HDL 61.10 >39.00 mg/dL   VLDL 6.8 0.0 - 40.0 mg/dL   LDL Cholesterol 86 0 - 99 mg/dL   Total CHOL/HDL Ratio 3    NonHDL 92.95        Assessment and Plan:    Amanda Costa was seen today for establish care.  Diagnoses and all orders for this visit:  Mass of left axilla -     CBC with Differential/Platelet -     Comprehensive metabolic panel - Okay repeat US after patient finishes breastfeeding for monitoring.   Hypotrichosis of eyelid, unspecified laterality -     bimatoprost (LATISSE) 0.03 % ophthalmic solution; Place one drop on applicator and apply evenly along the skin of the upper eyelid at base of eyelashes once daily at bedtime; repeat procedure for second eye (use a clean applicator).  Family history of early CAD -     Lipid panel  Hordeolum externum of right lower eyelid -     erythromycin (ROMYCIN) ophthalmic ointment; Apply thin ribbon to affected eye(s) once daily for 7 days.  . Reviewed expectations re: course of current medical issues. . Discussed self-management of symptoms. . Outlined signs and symptoms indicating need for more acute intervention. . Patient verbalized understanding and all questions were answered. . See orders for this visit as documented in the electronic medical record. . Patient received an After Visit Summary.   Briscoe Deutscher, [place], [place] 04/12/2016   Follow-up: No Follow-up on file.  Meds ordered this encounter  Medications  . DISCONTD: erythromycin ophthalmic ointment  . DISCONTD: amoxicillin (AMOXIL) 875 MG tablet    Sig: Take 1 tablet (875 mg total) by mouth 2 (two) times daily.    Dispense:  20 tablet    Refill:  0  . DISCONTD: bimatoprost (LATISSE) 0.03 % ophthalmic solution    Sig: Place one drop on applicator  and apply evenly along the skin of the upper eyelid at base of eyelashes once daily at bedtime; repeat procedure for second eye (use a clean applicator).    Dispense:  3 mL    Refill:  12  . bimatoprost (LATISSE) 0.03 % ophthalmic solution    Sig: Place one drop on applicator and apply evenly along the skin of the upper eyelid at base of eyelashes once daily at bedtime; repeat procedure for second eye (use a clean applicator).    Dispense:  3 mL    Refill:  12  . erythromycin (ROMYCIN) ophthalmic ointment    Sig: Apply thin ribbon to affected eye(s) once daily for 7 days.    Dispense:  1 g    Refill:  0   Medications Discontinued During This Encounter  Medication Reason  . docusate sodium (COLACE) 100 MG capsule Patient Preference  . bimatoprost (LATISSE) 0.03 % ophthalmic solution Reorder  . erythromycin ophthalmic ointment   . amoxicillin (AMOXIL) 875 MG tablet    Orders Placed This Encounter  Procedures  . CBC with Differential/Platelet  . Comprehensive metabolic panel  . Lipid panel

## 2017-01-15 LAB — LIPID PANEL
Cholesterol: 169 (ref 0–200)
HDL: 64 (ref 35–70)
LDL Cholesterol: 91
Triglycerides: 70 (ref 40–160)

## 2017-01-15 LAB — HEPATIC FUNCTION PANEL
ALT: 16 (ref 7–35)
AST: 16 (ref 13–35)

## 2017-01-15 LAB — BASIC METABOLIC PANEL: Glucose: 79

## 2017-01-15 LAB — HM HIV SCREENING LAB: HM HIV Screening: NEGATIVE

## 2017-01-15 LAB — HEMOGLOBIN A1C: Hemoglobin A1C: 4.9

## 2017-04-28 ENCOUNTER — Other Ambulatory Visit: Payer: Self-pay | Admitting: Family Medicine

## 2017-04-28 MED ORDER — OSELTAMIVIR PHOSPHATE 75 MG PO CAPS: 75.0000 mg | ORAL_CAPSULE | Freq: Two times a day (BID) | ORAL | 0 refills | Status: AC

## 2017-04-28 MED FILL — OSELTAMIVIR PHOSPHATE 75 MG: 75 | 5 days supply | Qty: 10 | Fill #0

## 2017-04-30 ENCOUNTER — Encounter: Payer: 59 | Admitting: Family Medicine

## 2017-05-09 ENCOUNTER — Encounter: Payer: Self-pay | Admitting: Family Medicine

## 2017-05-09 ENCOUNTER — Ambulatory Visit (INDEPENDENT_AMBULATORY_CARE_PROVIDER_SITE_OTHER): Payer: 59 | Admitting: Family Medicine

## 2017-05-09 VITALS — BP 110/76 | HR 65 | Temp 98.3°F | Wt 125.8 lb

## 2017-05-09 DIAGNOSIS — D229 Melanocytic nevi, unspecified: Secondary | ICD-10-CM

## 2017-05-09 DIAGNOSIS — Z0000 Encounter for general adult medical examination without abnormal findings: Secondary | ICD-10-CM | POA: Diagnosis not present

## 2017-05-09 NOTE — Progress Notes (Signed)
Subjective:    Amanda Costa is a 37 y.o. female and is here for a comprehensive physical exam.  Pertinent Gynecological History: Patient's last menstrual period was 04/14/2017.  OB History    Gravida Para Term Preterm AB Living   3 2 2  0 1 2   SAB TAB Ectopic Multiple Live Births   1 0 0 0 2     Health Maintenance Due  Topic Date Due  . PAP SMEAR  11/17/2002   PMHx, SurgHx, SocialHx, Medications, and Allergies were reviewed in the Visit Navigator and updated as appropriate.   Past Medical History:  Diagnosis Date  . Cystic fibrosis carrier   . Family history of early CAD 03/21/2015   Mother and uncle. Pt had an ECHO many years ago.  . Left breast lump    1-2 cm knot L axilla during end of pregnancy  . SVD (spontaneous vaginal delivery)    x 2  . Tricuspid regurgitation    Past Surgical History:  Procedure Laterality Date  . LASIK Bilateral   . WISDOM TOOTH EXTRACTION     Family History  Problem Relation Age of Onset  . Arthritis Mother   . Asthma Mother   . Hypertension Mother   . Hypothyroidism Mother   . Heart disease Mother   . Diabetes Father   . Heart disease Maternal Uncle   . Hearing loss Maternal Uncle   . Alcohol abuse Paternal Aunt   . Alcohol abuse Paternal Uncle   . Arthritis Maternal Grandmother   . Alcohol abuse Paternal Grandmother   . Diabetes Paternal Grandmother   . Skin cancer Paternal Grandmother   . Alcohol abuse Paternal Grandfather    Social History   Tobacco Use  . Smoking status: Never Smoker  . Smokeless tobacco: Never Used  Substance Use Topics  . Alcohol use: No  . Drug use: No   Review of Systems:   Pertinent items are noted in the HPI. Otherwise, ROS is negative.  Objective:   BP 110/76   Pulse 65   Temp 98.3 F (36.8 C) (Oral)   Wt 125 lb 12.8 oz (57.1 kg)   LMP 04/14/2017   SpO2 99%   BMI 22.01 kg/m    Wt Readings from Last 3 Encounters:  05/09/17 125 lb 12.8 oz (57.1 kg)  04/11/16 122 lb 9.6 oz  (55.6 kg)  04/26/15 162 lb 6.4 oz (73.7 kg)     Ht Readings from Last 3 Encounters:  04/11/16 5' 3.39" (1.61 m)  04/26/15 5' 4.5" (1.638 m)  09/01/12 5\' 5"  (1.651 m)   General appearance: alert, cooperative and appears stated age. Head: normocephalic, without obvious abnormality, atraumatic. Neck: no adenopathy, supple, symmetrical, trachea midline; thyroid not enlarged, symmetric, no tenderness/mass/nodules. Lungs: clear to auscultation bilaterally. Heart: regular rate and rhythm Abdomen: soft, non-tender; no masses,  no organomegaly. Extremities: extremities normal, atraumatic, no cyanosis or edema. Skin: skin color, texture, turgor normal, no rashes or lesions. Lymph: cervical, supraclavicular, and axillary nodes normal; no abnormal inguinal nodes palpated. Neurologic: grossly normal.  Assessment/Plan:   [place] was seen today for annual exam.  Diagnoses and all orders for this visit:  Routine physical examination  Numerous moles -     Ambulatory referral to Dermatology  Patient Counseling:   [x]     Nutrition: Stressed importance of moderation in sodium/caffeine intake, saturated fat and cholesterol, caloric balance, sufficient intake of fresh fruits, vegetables, fiber, calcium, iron, and 1 mg of folate supplement per day (  for females capable of pregnancy).   [x]      Stressed the importance of regular exercise.    [x]     Substance Abuse: Discussed cessation/primary prevention of tobacco, alcohol, or other drug use; driving or other dangerous activities under the influence; availability of treatment for abuse.    [x]      Injury prevention: Discussed safety belts, safety helmets, smoke detector, smoking near bedding or upholstery.    [x]      Sexuality: Discussed sexually transmitted diseases, partner selection, use of condoms, avoidance of unintended pregnancy  and contraceptive alternatives.    [x]     Dental health: Discussed importance of regular tooth brushing,  flossing, and dental visits.   [x]      Health maintenance and immunizations reviewed. Please refer to Health maintenance section.   Briscoe Deutscher, DO [place]

## 2017-07-23 DIAGNOSIS — Z6821 Body mass index (BMI) 21.0-21.9, adult: Secondary | ICD-10-CM | POA: Diagnosis not present

## 2017-07-23 DIAGNOSIS — Z01419 Encounter for gynecological examination (general) (routine) without abnormal findings: Secondary | ICD-10-CM | POA: Diagnosis not present

## 2017-07-23 DIAGNOSIS — Z1389 Encounter for screening for other disorder: Secondary | ICD-10-CM | POA: Diagnosis not present

## 2017-07-23 DIAGNOSIS — Z124 Encounter for screening for malignant neoplasm of cervix: Secondary | ICD-10-CM | POA: Diagnosis not present

## 2017-07-23 DIAGNOSIS — Z130 Encounter for screening for diseases of the blood and blood-forming organs and certain disorders involving the immune mechanism: Secondary | ICD-10-CM | POA: Diagnosis not present

## 2017-07-23 LAB — HM PAP SMEAR: HM Pap smear: NEGATIVE

## 2017-07-31 ENCOUNTER — Encounter: Payer: Self-pay | Admitting: Surgical

## 2017-08-15 NOTE — Telephone Encounter (Signed)
Preadmission screen  

## 2017-09-03 ENCOUNTER — Encounter: Payer: Self-pay | Admitting: Physical Therapy

## 2017-12-18 DIAGNOSIS — H5213 Myopia, bilateral: Secondary | ICD-10-CM | POA: Diagnosis not present

## 2017-12-24 DIAGNOSIS — D225 Melanocytic nevi of trunk: Secondary | ICD-10-CM | POA: Diagnosis not present

## 2017-12-24 DIAGNOSIS — Z23 Encounter for immunization: Secondary | ICD-10-CM | POA: Diagnosis not present

## 2017-12-24 DIAGNOSIS — L814 Other melanin hyperpigmentation: Secondary | ICD-10-CM | POA: Diagnosis not present

## 2018-01-05 DIAGNOSIS — D225 Melanocytic nevi of trunk: Secondary | ICD-10-CM | POA: Insufficient documentation

## 2018-01-05 DIAGNOSIS — L814 Other melanin hyperpigmentation: Secondary | ICD-10-CM | POA: Insufficient documentation

## 2018-03-10 ENCOUNTER — Telehealth: Payer: Self-pay | Admitting: Internal Medicine

## 2018-03-10 DIAGNOSIS — K219 Gastro-esophageal reflux disease without esophagitis: Secondary | ICD-10-CM

## 2018-03-10 DIAGNOSIS — R109 Unspecified abdominal pain: Secondary | ICD-10-CM

## 2018-03-10 DIAGNOSIS — R140 Abdominal distension (gaseous): Secondary | ICD-10-CM

## 2018-03-10 NOTE — Telephone Encounter (Signed)
Some GERD symptoms, recently worse x 6 months.  Occurring several days per week.  Sour brash.  Mild dysphagia to pills and soft foods.  Not to liquids.  No odynophagia. No nausea, no vomiting Left sided crampy abd pain, sharp bloating sensation with pain, worse after dinner.  Not really at breakfast or lunch.  Not nocturnal. BMs regular.  No diarrhea.  Tends to constipation, uses probiotic and Mg (400 mg once daily at bedtime) No melena or bleeding  Plan: Celiac panel, CBC, CMP, H pylori stool Ag If neg, then stop probiotic x 2 weeks to see if bloating improves If no change then OTC Nexium 20 mg 30 min before breakfast daily x 2 weeks Could try anti-spasmotic in future if not better

## 2018-03-11 ENCOUNTER — Other Ambulatory Visit (INDEPENDENT_AMBULATORY_CARE_PROVIDER_SITE_OTHER): Payer: 59

## 2018-03-11 DIAGNOSIS — R109 Unspecified abdominal pain: Secondary | ICD-10-CM

## 2018-03-11 DIAGNOSIS — K219 Gastro-esophageal reflux disease without esophagitis: Secondary | ICD-10-CM

## 2018-03-11 DIAGNOSIS — R140 Abdominal distension (gaseous): Secondary | ICD-10-CM | POA: Diagnosis not present

## 2018-03-11 LAB — CBC WITH DIFFERENTIAL/PLATELET
Basophils Absolute: 0.1 10*3/uL (ref 0.0–0.1)
Basophils Relative: 1.2 % (ref 0.0–3.0)
Eosinophils Absolute: 0.1 10*3/uL (ref 0.0–0.7)
Eosinophils Relative: 1.6 % (ref 0.0–5.0)
HCT: 37.7 % (ref 36.0–46.0)
Hemoglobin: 12.9 g/dL (ref 12.0–15.0)
Lymphocytes Relative: 32.7 % (ref 12.0–46.0)
Lymphs Abs: 2 10*3/uL (ref 0.7–4.0)
MCHC: 34.3 g/dL (ref 30.0–36.0)
MCV: 91.3 fl (ref 78.0–100.0)
Monocytes Absolute: 0.5 10*3/uL (ref 0.1–1.0)
Monocytes Relative: 8.1 % (ref 3.0–12.0)
Neutro Abs: 3.5 10*3/uL (ref 1.4–7.7)
Neutrophils Relative %: 56.4 % (ref 43.0–77.0)
Platelets: 248 10*3/uL (ref 150.0–400.0)
RBC: 4.13 Mil/uL (ref 3.87–5.11)
RDW: 12.5 % (ref 11.5–15.5)
WBC: 6.3 10*3/uL (ref 4.0–10.5)

## 2018-03-11 LAB — COMPREHENSIVE METABOLIC PANEL
ALT: 15 U/L (ref 0–35)
AST: 16 U/L (ref 0–37)
Albumin: 4.4 g/dL (ref 3.5–5.2)
Alkaline Phosphatase: 40 U/L (ref 39–117)
BUN: 14 mg/dL (ref 6–23)
CO2: 27 mEq/L (ref 19–32)
Calcium: 9.6 mg/dL (ref 8.4–10.5)
Chloride: 101 mEq/L (ref 96–112)
Creatinine, Ser: 0.75 mg/dL (ref 0.40–1.20)
GFR: 92.77 mL/min (ref >60.00)
Glucose, Bld: 91 mg/dL (ref 70–99)
Potassium: 3.7 mEq/L (ref 3.5–5.1)
Sodium: 135 mEq/L (ref 135–145)
Total Bilirubin: 0.5 mg/dL (ref 0.2–1.2)
Total Protein: 7.4 g/dL (ref 6.0–8.3)

## 2018-03-11 LAB — IGA: IgA: 194 mg/dL (ref 68–378)

## 2018-03-12 ENCOUNTER — Other Ambulatory Visit: Payer: 59

## 2018-03-12 DIAGNOSIS — R140 Abdominal distension (gaseous): Secondary | ICD-10-CM | POA: Diagnosis not present

## 2018-03-12 DIAGNOSIS — K219 Gastro-esophageal reflux disease without esophagitis: Secondary | ICD-10-CM | POA: Diagnosis not present

## 2018-03-12 DIAGNOSIS — R109 Unspecified abdominal pain: Secondary | ICD-10-CM | POA: Diagnosis not present

## 2018-03-12 LAB — TISSUE TRANSGLUTAMINASE ABS,IGG,IGA
(tTG) Ab, IgA: 1 U/mL
(tTG) Ab, IgG: 2 U/mL

## 2018-03-13 LAB — HELICOBACTER PYLORI  SPECIAL ANTIGEN
MICRO NUMBER:: 33764
SPECIMEN QUALITY: ADEQUATE

## 2018-04-16 ENCOUNTER — Encounter: Payer: Self-pay | Admitting: Family Medicine

## 2018-04-16 ENCOUNTER — Telehealth: Payer: Self-pay | Admitting: Internal Medicine

## 2018-04-16 DIAGNOSIS — R1032 Left lower quadrant pain: Secondary | ICD-10-CM

## 2018-04-16 DIAGNOSIS — R102 Pelvic and perineal pain: Secondary | ICD-10-CM

## 2018-04-16 MED ORDER — HYOSCYAMINE SULFATE SL 0.125 MG SL SUBL: SUBLINGUAL_TABLET | SUBLINGUAL | 1 refills | Status: DC

## 2018-04-16 NOTE — Telephone Encounter (Signed)
Received the following email from Cira Servant:  Hi Dr Hilarie Fredrickson -- I just wanted to reach out and see if you have any further recommendations -- I've completed the nexium x16mo and am happy to report that the heartburn is totally resolved. The bloating is a bit less frequentbut I'm still having 2-3 pretty bad evenings a week and it has startedup during the day now, too. Is there anything I can do (or take, or whatever) if I can feel it coming on to minimize it? Simethicone, tums, Alka-Seltzer, ibuprofen, Tylenol,H2 blockershave no effect. Heat pads are the only thing that offer a bit of relief. I stopped the probiotic a month ago, as you suggested.  Thank you so much for all you've done. I hope you are doing well -- I was hoping to catch you on Tuesday but our schedules never lined up.  Amanda Costa  --Please let patient know that I received her email. --For now would recommend the following -- would continue with low dose Nexium 20 mg daily for now Add Levsin 0.125 mg, use 1-2 sublingual tabs every 4-6 hours as needed for the cramping abd pain --Order SIBO breath test given symptoms of bloating and pain --Remain off probiotic --We can discuss next steps after SIBO results --Have her let me know if the levsin helps  Thanks JMP

## 2018-04-17 ENCOUNTER — Other Ambulatory Visit: Payer: Self-pay

## 2018-04-17 ENCOUNTER — Ambulatory Visit (HOSPITAL_COMMUNITY)
Admission: RE | Admit: 2018-04-17 | Discharge: 2018-04-17 | Disposition: A | Discharge status: Home / Self Care | Payer: 59 | Source: Ambulatory Visit | Attending: Family Medicine | Admitting: Family Medicine

## 2018-04-17 DIAGNOSIS — R102 Pelvic and perineal pain: Secondary | ICD-10-CM | POA: Insufficient documentation

## 2018-04-17 DIAGNOSIS — R1032 Left lower quadrant pain: Secondary | ICD-10-CM | POA: Insufficient documentation

## 2018-04-17 MED FILL — OSCIMIN SL 0.125 MG TABLET: 0.125 | 3 days supply | Qty: 30 | Fill #0

## 2018-04-17 NOTE — Telephone Encounter (Signed)
Referral has information she will tell them to let pt go and call your cell with information.

## 2018-04-17 NOTE — Telephone Encounter (Signed)
Left voicemail for patient to call back. 

## 2018-04-20 NOTE — Telephone Encounter (Signed)
I have spoken to patient to give Dr Vena Rua recommendations. She is in agreement with completing SIBO testing and will pick up testing kit at our front desk. In addition, she states that her PCP actually sent Levsin to her pharmacy for her over the weekend but she found it to be very sedating, so much so that it "knocked her out." She only took 1 tablet. She does note that she felt better when she awoke. Would you suggest that she try another antispasmotic in place of Levsin?

## 2018-04-20 NOTE — Telephone Encounter (Signed)
Pt return call °

## 2018-04-21 MED ORDER — DICYCLOMINE HCL 10 MG PO CAPS: 10.0000 mg | ORAL_CAPSULE | Freq: Four times a day (QID) | ORAL | 1 refills | Status: DC | PRN

## 2018-04-21 MED FILL — DICYCLOMINE 10 MG CAPSULE: 10 | 15 days supply | Qty: 60 | Fill #0

## 2018-04-21 NOTE — Telephone Encounter (Signed)
Rx sent for bentyl in place of Levsin. Patient advised.

## 2018-04-21 NOTE — Telephone Encounter (Signed)
Could try low-dose Bentyl, 10 mg every 6 hours as needed This would replace Levsin

## 2018-04-21 NOTE — Addendum Note (Signed)
Addended by: Larina Bras on: 04/21/2018 01:39 PM   Modules accepted: Orders

## 2018-05-25 ENCOUNTER — Ambulatory Visit: Payer: 59 | Admitting: Family Medicine

## 2018-10-14 DIAGNOSIS — R102 Pelvic and perineal pain: Secondary | ICD-10-CM | POA: Diagnosis not present

## 2018-10-14 MED FILL — MELODETTA 24 FE CHEWABLE TA: 1-20 | 84 days supply | Qty: 84 | Fill #0

## 2018-10-18 ENCOUNTER — Other Ambulatory Visit: Payer: Self-pay

## 2018-10-18 DIAGNOSIS — R102 Pelvic and perineal pain: Secondary | ICD-10-CM | POA: Insufficient documentation

## 2018-10-18 DIAGNOSIS — N63 Unspecified lump in unspecified breast: Secondary | ICD-10-CM | POA: Insufficient documentation

## 2018-10-27 MED FILL — NORETHIN ACE-ETH ESTRAD-FE: 1-20 | 84 days supply | Qty: 84 | Fill #0

## 2018-12-11 MED FILL — CHLORHEXIDINE 0.12% RINSE: 0.12 | 16 days supply | Qty: 473 | Fill #0

## 2018-12-11 MED FILL — traMADol HCL 50 MG TABS: 50 | 2 days supply | Qty: 10 | Fill #0

## 2018-12-11 MED FILL — AMOX-CLAV 875-125 MG TABLET: 875-125 | 10 days supply | Qty: 20 | Fill #0

## 2018-12-25 MED FILL — AMOX-CLAV 875-125 MG TABLET: 875-125 | 10 days supply | Qty: 20 | Fill #0

## 2018-12-31 DIAGNOSIS — D225 Melanocytic nevi of trunk: Secondary | ICD-10-CM | POA: Diagnosis not present

## 2018-12-31 DIAGNOSIS — L814 Other melanin hyperpigmentation: Secondary | ICD-10-CM | POA: Diagnosis not present

## 2018-12-31 DIAGNOSIS — Z23 Encounter for immunization: Secondary | ICD-10-CM | POA: Diagnosis not present

## 2019-01-03 MED FILL — NORETHIN ACE-ETH ESTRAD-FE: 1-20 | 84 days supply | Qty: 84 | Fill #1

## 2019-03-04 DIAGNOSIS — E669 Obesity, unspecified: Secondary | ICD-10-CM | POA: Diagnosis not present

## 2019-03-04 DIAGNOSIS — Z01419 Encounter for gynecological examination (general) (routine) without abnormal findings: Secondary | ICD-10-CM | POA: Diagnosis not present

## 2019-03-04 DIAGNOSIS — Z1389 Encounter for screening for other disorder: Secondary | ICD-10-CM | POA: Diagnosis not present

## 2019-03-04 DIAGNOSIS — Z130 Encounter for screening for diseases of the blood and blood-forming organs and certain disorders involving the immune mechanism: Secondary | ICD-10-CM | POA: Diagnosis not present

## 2019-03-04 DIAGNOSIS — Z3041 Encounter for surveillance of contraceptive pills: Secondary | ICD-10-CM | POA: Diagnosis not present

## 2019-04-07 MED FILL — MELODETTA 24 FE CHEWABLE TA: 1-20 | 84 days supply | Qty: 84 | Fill #2

## 2019-06-19 MED FILL — MELODETTA 24 FE CHEWABLE TA: 1-20 | 84 days supply | Qty: 84 | Fill #3

## 2019-09-21 MED FILL — MELODETTA 24 FE CHEWABLE TA: 1-20 | 84 days supply | Qty: 84 | Fill #4

## 2019-12-10 ENCOUNTER — Other Ambulatory Visit (HOSPITAL_COMMUNITY): Payer: Self-pay | Admitting: Obstetrics and Gynecology

## 2019-12-10 MED FILL — NORETHIN ACE-ETH ESTRAD-FE: 1-20 | 84 days supply | Qty: 84 | Fill #0

## 2020-02-20 IMAGING — US US PELVIS COMPLETE
1 series · 13 of 25 positions shown · non-contrast
Comparison: None.

CLINICAL DATA: Pelvic and left lower quadrant pain. LMP 03/29/2018.

EXAM:
TRANSABDOMINAL AND TRANSVAGINAL ULTRASOUND OF PELVIS
DOPPLER ULTRASOUND OF OVARIES
TECHNIQUE: Both transabdominal and transvaginal ultrasound examinations of the
pelvis were performed. Transabdominal technique was performed for
global imaging of the pelvis including uterus, ovaries, adnexal
regions, and pelvic cul-de-sac.
It was necessary to proceed with endovaginal exam following the
transabdominal exam to visualize the endometrium and ovaries. Color
and duplex Doppler ultrasound was utilized to evaluate blood flow to
the ovaries.

[Series 1: us pelvis complete · 13 of 106 slices shown]
[im 1/106]
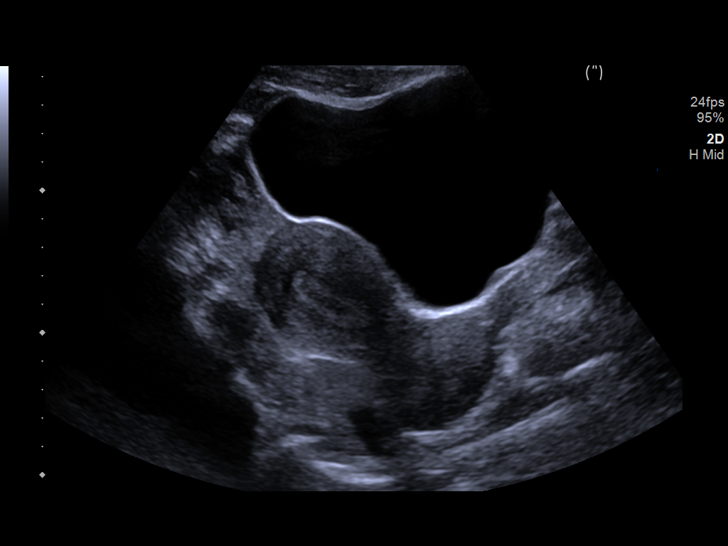
[im 9/106]
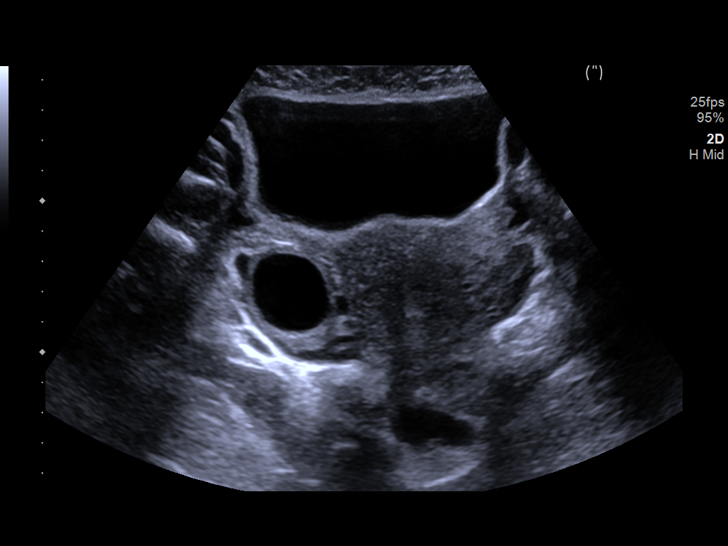
[im 18/106]
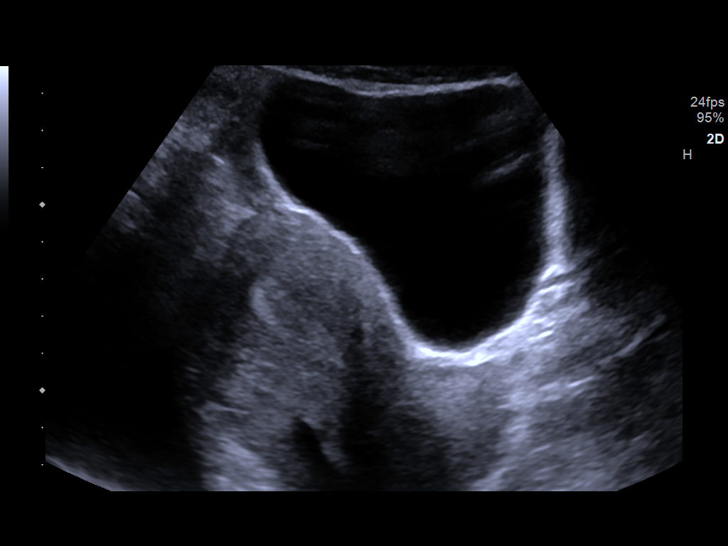
[im 27/106]
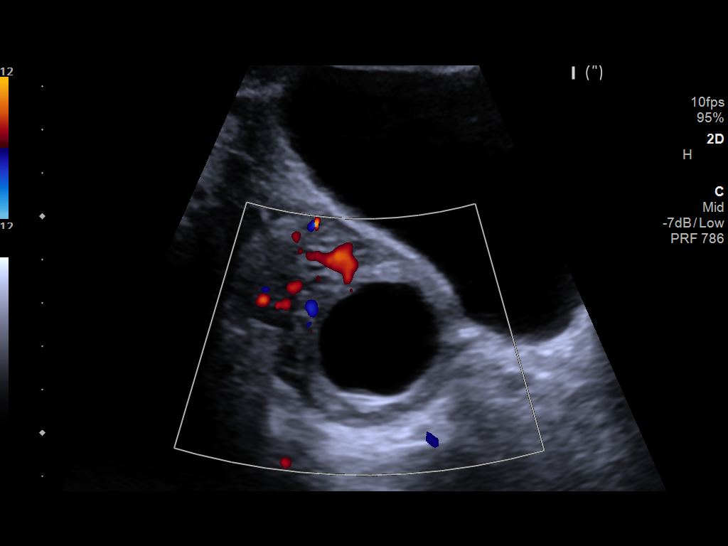
[im 36/106]
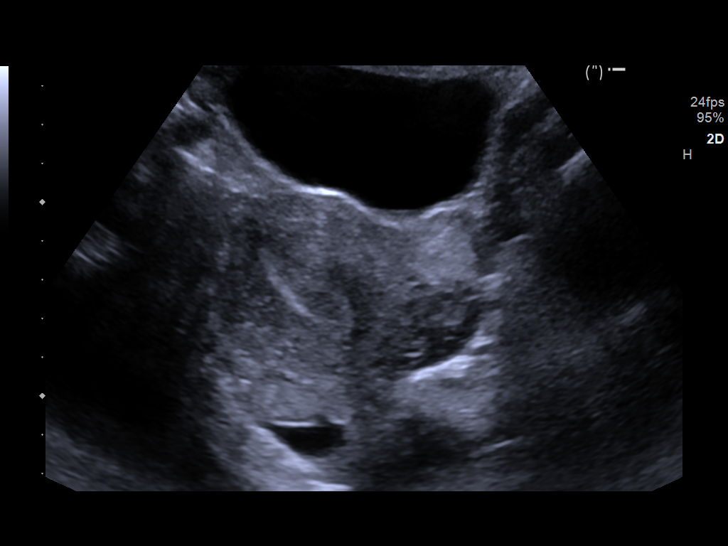
[im 44/106]
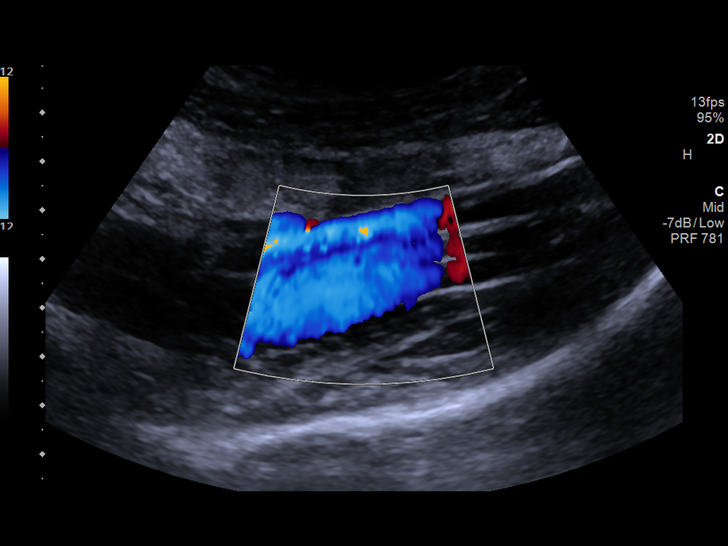
[im 53/106]
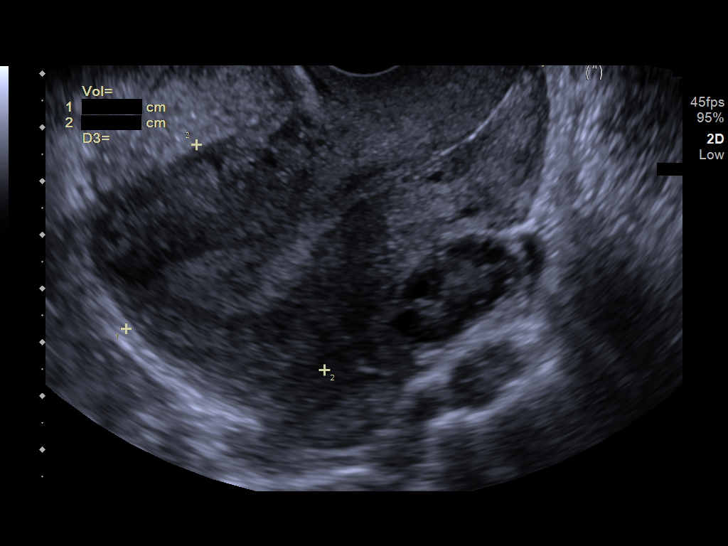
[im 62/106]
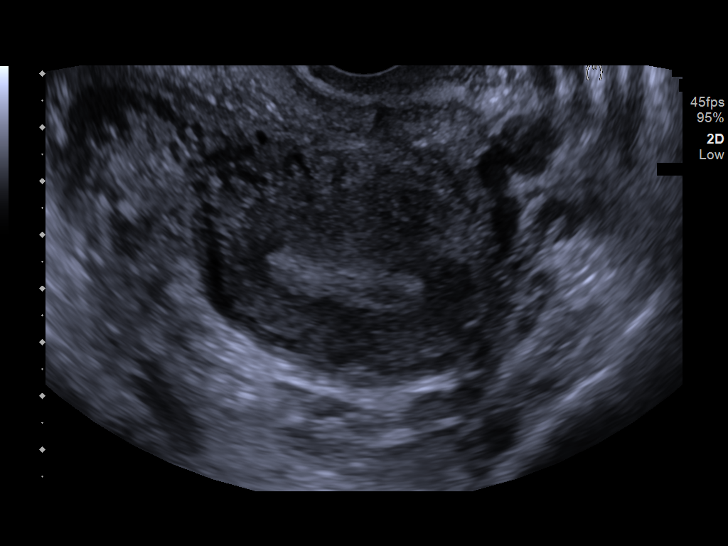
[im 71/106]
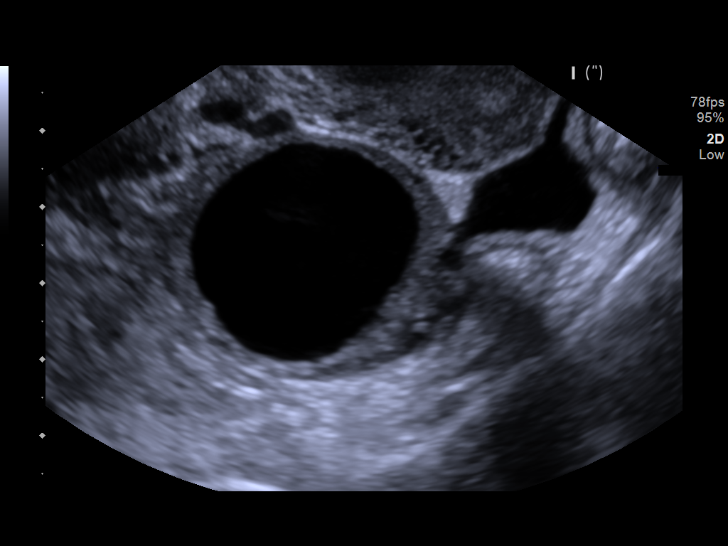
[im 79/106]
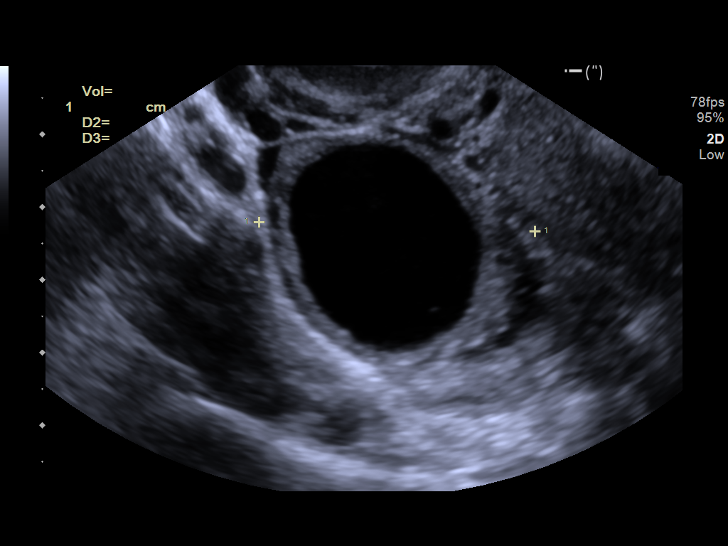
[im 88/106]
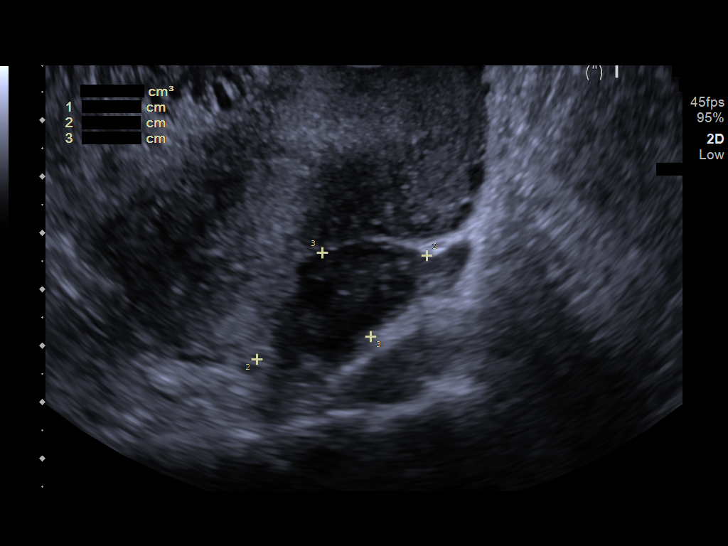
[im 97/106]
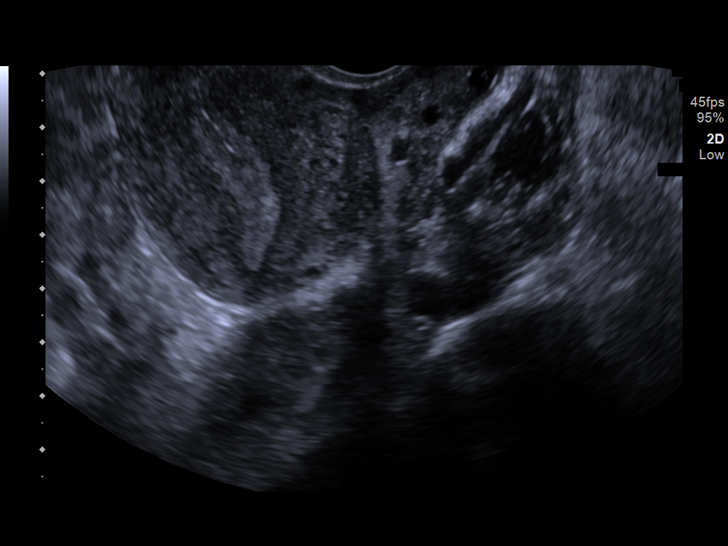
[im 106/106]
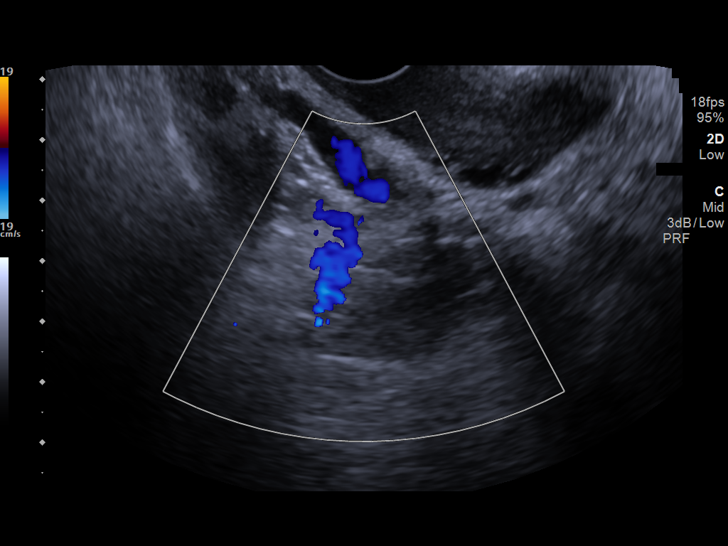

[13 of 25 positions shown; findings below may reference images not displayed]

FINDINGS: Uterus

Measurements: 9.5 x 4.4 x 5.8 cm = volume: 125 mL. No fibroids or
other mass visualized.

Endometrium

Thickness: 14 mm.  No focal abnormality visualized.

Right ovary

Measurements: 4.1 x 3.3 x 3.8 cm = volume: 26.8 mL. A simple
follicular cyst is seen measuring 3.1 x 2.7 x 2.7 cm

Left ovary

Measurements: 3.5 x 1.7 x 2.8 cm = volume: 8.7 mL. Normal
appearance/no adnexal mass.

Pulsed Doppler evaluation of both ovaries demonstrates normal
low-resistance arterial and venous waveforms.

Other findings

No abnormal free fluid.
IMPRESSION: Normal appearance of uterus and both ovaries. No pelvic mass or
other significant abnormality identified.

No sonographic evidence for ovarian torsion.

## 2020-03-01 MED FILL — NORETHIN ACE-ETH ESTRAD-FE: 1-20 | 84 days supply | Qty: 84 | Fill #1

## 2020-03-23 ENCOUNTER — Other Ambulatory Visit: Payer: Self-pay | Admitting: Family Medicine

## 2020-03-23 ENCOUNTER — Other Ambulatory Visit: Payer: Self-pay

## 2020-03-23 ENCOUNTER — Ambulatory Visit (INDEPENDENT_AMBULATORY_CARE_PROVIDER_SITE_OTHER): Payer: 59 | Admitting: Family Medicine

## 2020-03-23 ENCOUNTER — Ambulatory Visit (INDEPENDENT_AMBULATORY_CARE_PROVIDER_SITE_OTHER): Payer: Self-pay

## 2020-03-23 ENCOUNTER — Ambulatory Visit: Payer: Self-pay

## 2020-03-23 ENCOUNTER — Encounter: Payer: Self-pay | Admitting: Family Medicine

## 2020-03-23 VITALS — BP 102/72 | HR 64 | Ht 63.39 in | Wt 124.4 lb

## 2020-03-23 DIAGNOSIS — G8929 Other chronic pain: Secondary | ICD-10-CM

## 2020-03-23 DIAGNOSIS — M25561 Pain in right knee: Secondary | ICD-10-CM

## 2020-03-23 DIAGNOSIS — M2550 Pain in unspecified joint: Secondary | ICD-10-CM

## 2020-03-23 DIAGNOSIS — M25562 Pain in left knee: Secondary | ICD-10-CM

## 2020-03-23 LAB — COMPREHENSIVE METABOLIC PANEL
ALT: 25 U/L (ref 0–35)
AST: 23 U/L (ref 0–37)
Albumin: 4.4 g/dL (ref 3.5–5.2)
Alkaline Phosphatase: 45 U/L (ref 39–117)
BUN: 14 mg/dL (ref 6–23)
CO2: 28 mEq/L (ref 19–32)
Calcium: 9.9 mg/dL (ref 8.4–10.5)
Chloride: 103 mEq/L (ref 96–112)
Creatinine, Ser: 0.83 mg/dL (ref 0.40–1.20)
GFR: 89.48 mL/min (ref >60.00)
Glucose, Bld: 75 mg/dL (ref 70–99)
Potassium: 3.9 mEq/L (ref 3.5–5.1)
Sodium: 136 mEq/L (ref 135–145)
Total Bilirubin: 0.5 mg/dL (ref 0.2–1.2)
Total Protein: 7.4 g/dL (ref 6.0–8.3)

## 2020-03-23 LAB — CBC WITH DIFFERENTIAL/PLATELET
Basophils Absolute: 0 10*3/uL (ref 0.0–0.1)
Basophils Relative: 0.7 % (ref 0.0–3.0)
Eosinophils Absolute: 0 10*3/uL (ref 0.0–0.7)
Eosinophils Relative: 0.3 % (ref 0.0–5.0)
HCT: 39.9 % (ref 36.0–46.0)
Hemoglobin: 13.4 g/dL (ref 12.0–15.0)
Lymphocytes Relative: 20.3 % (ref 12.0–46.0)
Lymphs Abs: 1.4 10*3/uL (ref 0.7–4.0)
MCHC: 33.7 g/dL (ref 30.0–36.0)
MCV: 92 fl (ref 78.0–100.0)
Monocytes Absolute: 0.4 10*3/uL (ref 0.1–1.0)
Monocytes Relative: 6.5 % (ref 3.0–12.0)
Neutro Abs: 4.9 10*3/uL (ref 1.4–7.7)
Neutrophils Relative %: 72.2 % (ref 43.0–77.0)
Platelets: 228 10*3/uL (ref 150.0–400.0)
RBC: 4.33 Mil/uL (ref 3.87–5.11)
RDW: 12.5 % (ref 11.5–15.5)
WBC: 6.8 10*3/uL (ref 4.0–10.5)

## 2020-03-23 LAB — CK: Total CK: 108 U/L (ref 7–177)

## 2020-03-23 LAB — SEDIMENTATION RATE: Sed Rate: 10 mm/hr (ref 0–20)

## 2020-03-23 LAB — TSH: TSH: 1.81 u[IU]/mL (ref 0.35–4.50)

## 2020-03-23 MED ORDER — MELOXICAM 7.5 MG PO TABS: 7.5000 mg | ORAL_TABLET | Freq: Every day | ORAL | 1 refills | Status: DC

## 2020-03-23 MED FILL — MELOXICAM 7.5 MG TABLET: 7.5 | 30 days supply | Qty: 60 | Fill #0

## 2020-03-23 NOTE — Patient Instructions (Addendum)
Thank you for coming in today.  Please get an Xray today before you leave  I've referred you to Physical Therapy.  Let us know if you don't hear from them in one week.  I will try to get MRI  Please get labs today before you leave

## 2020-03-23 NOTE — Progress Notes (Signed)
Labs look normal to me

## 2020-03-23 NOTE — Progress Notes (Signed)
Subjective:    CC: B knee pain  I, Amanda Costa, LAT, ATC, am serving as scribe for Dr. Lynne Leader.  HPI: Pt is a 40 y/o female presenting w/ B knee pain w/ her R knee most recently aggravated after running on the beach over Thanksgiving.  Her L knee pain is chronic and has been bothering her for years.  She is a runner and runs 3 miles 3-4 days/week and does one longer 5 mile run 1x/week.  She locates her pain to her B ant knee under the patella.   Knee swelling: for a few weeks after Thanksgiving on the R knee but mostly resolved Knee mechanical symptoms: Locking and catching clicking bilaterally. Aggravating factors: loaded knee flexion; climbing stairs; pain at night  She is tried treating with physician directed home exercise program since November.  She has been doing VMO strengthening exercises with little benefit.  She had to modify exercise and reduce running which is significantly affecting her quality of life.  Treatments tried: knee braces (DJO); IBU; ice; Pennsaid; CBD oil  Pertinent review of Systems: No fevers or chills.  Positive for bothersome nighttime aching.  This interferes with sleep, and does not improve significantly with ibuprofen.  Relevant historical information: Healthy otherwise.   Objective:    Vitals:   03/23/20 1111  BP: 102/72  Pulse: 64  SpO2: 100%   General: Well Developed, well nourished, and in no acute distress.   MSK: Right knee decreased VMO bulk otherwise normal appearing. Normal motion with minimal crepitation. Mildly tender palpation medial joint line. Stable ligamentous exam. Positive medial McMurray's test. Intact strength.   Left knee decreased VMO bulk and mild effusion otherwise normal appearing. Mildly tender palpation medial joint line. Stable ligamentous exam. Positive medial McMurray's test. Intact strength.  Normal gait.   Lab and Radiology Results  X-ray images bilateral knees obtained today personally  and independently interpreted  Right knee: No significant DJD.  No acute fractures.    Left knee: Mild degenerative changes.  No acute fractures.  Await formal radiology review  Diagnostic Limited MSK Ultrasound of: Bilateral knee Right knee:: No joint effusion.  Quad tendon normal. Patellar tendon normal. Medial joint line normal. Lateral joint line normal. Posterior knee no Baker's cyst.  Left knee: Quad tendon normal. Small joint effusion present. Patellar tendon small calcifications at proximal tendon origin at inferior patellar pole. Medial and lateral joint line are normal. Posterior knee tiny Baker's cyst measuring 2 mm. Impression: Left knee effusion and small Baker's cyst.   Impression and Recommendations:    Assessment and Plan: 40 y.o. female with bilateral knee pain.  Right knee pain more acute ongoing since November.  This occurred after a twisting injury running on the beach in November.  She has mechanical knee symptoms and a positive McMurray's test.  She has failed to improve with physician directed conservative management over 6 weeks.  At this point concern for meniscus injury.  Plan for MRI and x-ray.  Recheck after these test.  Left knee pain: Chronic and ongoing for years however worsening recently.  She does have mechanical symptoms as well as is less dominant.  Plan for physical therapy.  Also obtain x-ray and MRI.  Concern for OCD lesion or possibly meniscus tear as well.  Again has had trial of physician directed conservative management over 6 weeks..  Additionally I will proceed with a limited laboratory/rheumatologic work-up.  She has bothersome aching in the evenings that interfere with sleep.  This is a bit concerning and would benefit from limited work-up listed below.  Trial of meloxicam in the evening may be more beneficial than ibuprofen.  PDMP not reviewed this encounter. Orders Placed This Encounter  Procedures  . [place] LIMITED JOINT SPACE  STRUCTURES LOW BILAT(NO LINKED CHARGES)    Order Specific Question:   Reason for Exam (SYMPTOM  OR DIAGNOSIS REQUIRED)    Answer:   B knee pain    Order Specific Question:   Preferred imaging location?    Answer:   [facility]  . DG Knee AP/LAT W/Sunrise Left    Standing Status:   Future    Standing Expiration Date:   04/23/2020    Order Specific Question:   Reason for Exam (SYMPTOM  OR DIAGNOSIS REQUIRED)    Answer:   L knee pain    Order Specific Question:   Is patient pregnant?    Answer:   No    Order Specific Question:   Preferred imaging location?    Answer:   [facility]  . DG Knee AP/LAT W/Sunrise Right    Standing Status:   Future    Standing Expiration Date:   04/23/2020    Order Specific Question:   Reason for Exam (SYMPTOM  OR DIAGNOSIS REQUIRED)    Answer:   R knee pain    Order Specific Question:   Is patient pregnant?    Answer:   No    Order Specific Question:   Preferred imaging location?    Answer:   [facility]  . MR Knee Left  Wo Contrast    Standing Status:   Future    Standing Expiration Date:   03/23/2021    Order Specific Question:   What is the patient's sedation requirement?    Answer:   No Sedation    Order Specific Question:   Does the patient have a pacemaker or implanted devices?    Answer:   No    Order Specific Question:   Preferred imaging location?    Answer:   [facility] (table limit-350lbs)  . MR Knee Right Wo Contrast    Standing Status:   Future    Standing Expiration Date:   03/23/2021    Order Specific Question:   What is the patient's sedation requirement?    Answer:   No Sedation    Order Specific Question:   Does the patient have a pacemaker or implanted devices?    Answer:   No    Order Specific Question:   Preferred imaging location?    Answer:   [facility] (table limit-350lbs)  . CBC with Differential/Platelet    Standing Status:   Future    Standing Expiration Date:    03/23/2021  . TSH    Standing Status:   Future    Standing Expiration Date:   03/23/2021  . Sedimentation rate    Standing Status:   Future    Standing Expiration Date:   03/23/2021  . CK    Standing Status:   Future    Standing Expiration Date:   03/23/2021  . Comp Met (CMET)    Standing Status:   Future    Standing Expiration Date:   03/23/2021  . Ambulatory referral to Physical Therapy    Referral Priority:   Routine    Referral Type:   Physical Medicine    Referral Reason:   Specialty Services Required    Requested Specialty:  Physical Therapy   Meds ordered this encounter  Medications  . meloxicam (MOBIC) 7.5 MG tablet    Sig: Take 1-2 tablets (7.5-15 mg total) by mouth daily.    Dispense:  60 tablet    Refill:  1    Discussed warning signs or symptoms. Please see discharge instructions. Patient expresses understanding.   The above documentation has been reviewed and is accurate and complete Lynne Leader, M.D.

## 2020-03-24 NOTE — Progress Notes (Signed)
Xray knee shows small joint effusion. I thought there was a little arthritis.

## 2020-03-24 NOTE — Progress Notes (Signed)
Xray knee shows small joint effusion. I thought there was a little arthritis.

## 2020-04-01 ENCOUNTER — Ambulatory Visit (INDEPENDENT_AMBULATORY_CARE_PROVIDER_SITE_OTHER): Payer: 59

## 2020-04-01 ENCOUNTER — Other Ambulatory Visit: Payer: Self-pay

## 2020-04-01 DIAGNOSIS — M7122 Synovial cyst of popliteal space [Baker], left knee: Secondary | ICD-10-CM

## 2020-04-01 DIAGNOSIS — M25461 Effusion, right knee: Secondary | ICD-10-CM | POA: Diagnosis not present

## 2020-04-01 DIAGNOSIS — R600 Localized edema: Secondary | ICD-10-CM | POA: Diagnosis not present

## 2020-04-01 DIAGNOSIS — M25561 Pain in right knee: Secondary | ICD-10-CM

## 2020-04-01 DIAGNOSIS — G8929 Other chronic pain: Secondary | ICD-10-CM

## 2020-04-01 DIAGNOSIS — M25562 Pain in left knee: Secondary | ICD-10-CM

## 2020-04-01 DIAGNOSIS — M25462 Effusion, left knee: Secondary | ICD-10-CM | POA: Diagnosis not present

## 2020-04-01 DIAGNOSIS — M1711 Unilateral primary osteoarthritis, right knee: Secondary | ICD-10-CM | POA: Diagnosis not present

## 2020-04-03 NOTE — Progress Notes (Signed)
MRI left knee shows some mild to moderate cartilage thinning and areas of inflammation. But no OCDs or ligament of meniscus tears. Plan for PT

## 2020-04-03 NOTE — Progress Notes (Signed)
MRI right knee shows some mild knee cartilage thinning. There is also a benign cartilage tumor (enchondroma). We dont need to do anything with that.  Plan for PT.

## 2020-04-20 ENCOUNTER — Ambulatory Visit: Payer: 59 | Admitting: Physical Therapy

## 2020-04-20 ENCOUNTER — Ambulatory Visit (INDEPENDENT_AMBULATORY_CARE_PROVIDER_SITE_OTHER): Payer: 59 | Admitting: Physical Therapy

## 2020-04-20 ENCOUNTER — Other Ambulatory Visit: Payer: Self-pay

## 2020-04-20 ENCOUNTER — Encounter: Payer: Self-pay | Admitting: Physical Therapy

## 2020-04-20 DIAGNOSIS — M25562 Pain in left knee: Secondary | ICD-10-CM

## 2020-04-20 DIAGNOSIS — M6281 Muscle weakness (generalized): Secondary | ICD-10-CM

## 2020-04-20 DIAGNOSIS — M25561 Pain in right knee: Secondary | ICD-10-CM

## 2020-04-20 NOTE — Patient Instructions (Signed)
HEP: [email or web address] access code  Exercises Prone Quadriceps Stretch with Strap - 2 x daily - 7 x weekly - 1 sets - 3 reps - 30 hold Wall Squat - 2 x daily - 3-4 x weekly - 2 sets - 5 reps - 30 hold Squat with Chair Touch and Resistance Loop - 1 x daily - 7 x weekly - 3 sets - 10 reps Quadriceps Mobilization with Foam Roll - 1 x daily - 7 x weekly - 1 sets - 1 reps - 31min hold

## 2020-04-20 NOTE — Therapy (Signed)
[facility] [address] [place], [place], [address] Phone: [phone]   Fax:  [phone]  Physical Therapy Evaluation  Patient Details  Name: Amanda Costa MRN: [id number] Date of Birth: 08/13/1980 Referring Provider (PT): Dr. Georgina Snell   Encounter Date: 04/20/2020   PT End of Session - 04/20/20 1631    Visit Number 1    Number of Visits 13    Date for PT Re-Evaluation 05/20/20    Authorization Type [place]    PT Start Time 0930    PT Stop Time 1020    PT Time Calculation (min) 50 min    Activity Tolerance Patient tolerated treatment well    Behavior During Therapy [facility] for tasks assessed/performed           Past Medical History:  Diagnosis Date  . Cystic fibrosis carrier   . Family history of early CAD 03/21/2015   Mother and uncle. Pt had an ECHO many years ago.  . Left breast lump    1-2 cm knot L axilla during end of pregnancy  . SVD (spontaneous vaginal delivery)    x 2  . Tricuspid regurgitation     Past Surgical History:  Procedure Laterality Date  . LASIK Bilateral   . WISDOM TOOTH EXTRACTION      There were no vitals filed for this visit.    SUBJECTIVE Chief complaint:  Chronic L knee pain, Subacute R knee pain Onset: Pt states the knee pain started in the R after running on the beach over Thanksgiving break with her children. She had some swelling that happened directly after. Pt is a an avid runner and she wears [place] braces when she runs. She states she does not have pain after runs if she is wearing them.  She does not have pain while she is running but it will hurt after.  Pt runs in [employer] carbonX shoes. Pt states her main forms of exercise are running and yoga. She has been doing some PFPS exercises given to her by her husband who works in [employer] [place]. Pt states it is a dull ache on the L and sharp on the R. When she sleeps at night, she has a dull ache and sore across the top of the  patella around the quad tendon area. Pt denies red flags (night pain, unexpected weight loss, fever, nausea, vomiting)  Pt states she would like to get back to pain free playing with kids, running, household activities. She currently runs about 12-15 miles a week, runs 4x/ week. She runs through her neighborhood with no hill intervals.     MD: Dr. Lynne Leader Pain: 0/10 Present, 0/10 Best, 5/10 Worst: Aggravating factors: up and down stairs, kneeling Easing factors: resting, icing, contrast therapy, sleep with pillow between knees  Knee surgery: No Recent knee trauma: No Prior history of knee injury or pain: No to sx. Yes to pain in L. Pain quality: pain quality: aching, dull and sharp Radiating symptoms: No  Numbness/Tingling: No Dominant hand: right Imaging: Yes No significant findings, general wear and aging   OBJECTIVE  MUSCULOSKELETAL: Bulk: Normal No trophic changes noted to lower extremities. No ecchymosis, erythema, or edema noted around knee. No gross knee deformity noted  Posture No gross abnormalities noted in standing or seated posture  Lumbar/Hip AROM: WFL   Gait No gross deficits in gait identified- mild Trendelenburg   Squat Dynamc valgus on descent, mild ankle pronation  Palpation Pain with palpation around  distal pole of R patella, tenderness and hypertonicity of bilateral quadriceps, especially around VL and rec fem  Strength R/L 4/5 Hip flexion 4/5 Hip extension  4/5 Hip abduction 4+/5 Hip adduction 5/5 Knee extension 5/5 Knee flexion 5/5 Ankle Dorsiflexion 5/5 Ankle Plantarflexion  AROM  Knee  R Flexion: 121deg L Flexion: 123 deg Extension: WNL bilat   Ankle: mod limited in DF Hip: [facility]    Muscle Length Hamstring length: -20 deg in 90/90  Quad length [facility]): positive    SPECIAL TESTS  Ligamentous Stability  Anterior Drawer Test: Negative Lachman Test: Negative Posterior Drawer Test: Negative Posterior Sag Sign:  Negative Valgus Stress Test: Negative Varus Stress Test: Negative  Meniscus Tests  McMurray Test: Negative  Motor Control: Step down/up assessment: increased dynamic valgus bilaterally, crepitus on L, pain laterally on R Squat assessment: increased forward trunk lean, decreased hip external rotation control, increased forward trunk lean, decreased ankle ROM Single Leg Trendelenburg Test: Positive bilat with stance during gait   ASSESSMENT Pt is a 40 y.o. female presenting to PT eval for complaints of bilateral knee pain. Pt presents with decreased knee flexion ROM, increased quadriceps tone, frontal plane motor control deficits, and decreased hip strength. Pt's s/s are consistent with patellofemoral pain syndrome. Pt's current impairments limit her ability to perform daily caregiver duties, home/community mobility, and physical fitness and exercise. Pt would benefit from continued skilled therapy in order to maximize functional LE strength, prevent further functional decline, and improve LE motor control for pain free return to PLOF.    Plan HEP: [email or web address] access code  Exercises Prone Quadriceps Stretch with Strap - 2 x daily - 7 x weekly - 1 sets - 3 reps - 30 hold Wall Squat - 2 x daily - 3-4 x weekly - 2 sets - 5 reps - 30 hold Squat with Chair Touch and Resistance Loop - 1 x daily - 7 x weekly - 3 sets - 10 reps Quadriceps Mobilization with Foam Roll - 1 x daily - 7 x weekly - 1 sets - 1 reps - 49min hold     [facility] PT Assessment - 04/20/20 1603      Assessment   Medical Diagnosis L knee pain; R knee pain    Referring Provider (PT) Dr. Georgina Snell    Prior Therapy N/A      Precautions   Precautions None      Restrictions   Weight Bearing Restrictions No      Balance Screen   Has the patient fallen in the past 6 months No      [place] residence    Living Arrangements Spouse/significant other;Children      Prior Function   Level of  Independence Independent      Cognition   Overall Cognitive Status Within Functional Limits for tasks assessed                      Objective measurements completed on examination: See above findings.       [facility] Adult PT Treatment/Exercise - 04/20/20 1615      Exercises   Exercises Knee/Hip;Ankle;Other Exercises      Knee/Hip Exercises: Stretches   Sports administrator Both;3 reps;30 seconds    Quad Stretch Limitations prone      Knee/Hip Exercises: Standing   Functional Squat 20 reps    Functional Squat Limitations GTB around knees 2x10    Other Standing Knee Exercises Wall sit 30s 3x  Manual Therapy   Manual Therapy Joint mobilization;Soft tissue mobilization    Soft tissue mobilization bilat quadriceps, VL and rec fem                  PT Education - 04/20/20 1630    Education Details POC, diagnosis, prognosis, exam findings, anatomy, acceptable exercise and pain threshold    Person(s) Educated Patient    Methods Explanation;Demonstration    Comprehension Verbalized understanding            PT Short Term Goals - 04/20/20 1650      PT SHORT TERM GOAL #1   Title Pt will become independent with HEP.    Time 2    Period Weeks    Status New    Target Date 05/04/20      PT SHORT TERM GOAL #2   Title Pt will improve MMT of hip abductors by 1/2 MMT in order to demonstrate clinically significant improvement in strength.    Time 4    Period Weeks    Status New    Target Date 05/18/20             PT Long Term Goals - 04/20/20 1652      PT LONG TERM GOAL #1   Title Pt will be able to squat, lift, and run without pain in order to demonstrate full functional return to household duties and physical fitness/exercise.    Time 8    Period Weeks    Status New    Target Date 06/15/20                  Plan - 04/20/20 1632    Personal Factors and Comorbidities Time since onset of injury/illness/exacerbation    Examination-Activity  Limitations Stairs;Squat;Other;Bend;Caring for Others    Examination-Participation Restrictions Other;Community Activity    Stability/Clinical Decision Making Stable/Uncomplicated    Clinical Decision Making Low    Rehab Potential Good    PT Frequency 2x / week    PT Duration 6 weeks    PT Treatment/Interventions ADLs/Self Care Home Management;Electrical Stimulation;Moist Heat;Cryotherapy;Iontophoresis 4mg /ml Dexamethasone;Contrast Bath;Gait training;Stair training;Functional mobility training;Therapeutic activities;Therapeutic exercise;Balance training;Neuromuscular re-education;Patient/family education;Manual techniques;Passive range of motion;Dry needling;Joint Manipulations    Consulted and Agree with Plan of Care Patient           Patient will benefit from skilled therapeutic intervention in order to improve the following deficits and impairments:  Decreased mobility,Increased muscle spasms,Impaired flexibility,Decreased range of motion,Pain,Decreased activity tolerance  Visit Diagnosis: Pain in joint of left knee  Pain in joint of right knee  Muscle weakness (generalized)     Problem List Patient Active Problem List   Diagnosis Date Noted  . Breast lump 10/18/2018  . Pelvic and perineal pain 10/18/2018  . Lentigo 01/05/2018  . Melanocytic nevi of trunk 01/05/2018  . Hypotrichosis of eyelid 04/12/2016  . Mass of left axilla 04/12/2016  . Family history of early CAD 03/21/2015    Daleen Bo PT, DPT 04/20/20 4:55 PM   [facility] [address] [place], [place], [address] Phone: [phone]   Fax:  [phone]  Name: Amanda Costa MRN: [id number] Date of Birth: 1980/03/24

## 2020-04-27 ENCOUNTER — Ambulatory Visit (INDEPENDENT_AMBULATORY_CARE_PROVIDER_SITE_OTHER): Payer: 59 | Admitting: Physical Therapy

## 2020-04-27 ENCOUNTER — Other Ambulatory Visit: Payer: Self-pay

## 2020-04-27 ENCOUNTER — Encounter: Payer: Self-pay | Admitting: Physical Therapy

## 2020-04-27 DIAGNOSIS — M6281 Muscle weakness (generalized): Secondary | ICD-10-CM

## 2020-04-27 DIAGNOSIS — M25562 Pain in left knee: Secondary | ICD-10-CM

## 2020-04-27 DIAGNOSIS — M25561 Pain in right knee: Secondary | ICD-10-CM | POA: Diagnosis not present

## 2020-04-29 ENCOUNTER — Encounter: Payer: Self-pay | Admitting: Physical Therapy

## 2020-04-29 NOTE — Therapy (Signed)
[facility] [address] [place], [place], [address] Phone: [phone]   Fax:  [phone]  Physical Therapy Treatment  Patient Details  Name: Amanda Costa MRN: [id number] Date of Birth: 06-08-1980 Referring Provider (PT): Dr. Georgina Snell   Encounter Date: 04/27/2020   PT End of Session - 04/29/20 1450    Visit Number 2    Number of Visits 13    Date for PT Re-Evaluation 05/20/20    Authorization Type [place]    PT Start Time [phone]    PT Stop Time 1014    PT Time Calculation (min) 42 min    Activity Tolerance Patient tolerated treatment well    Behavior During Therapy [facility] for tasks assessed/performed           Past Medical History:  Diagnosis Date  . Cystic fibrosis carrier   . Family history of early CAD 03/21/2015   Mother and uncle. Pt had an ECHO many years ago.  . Left breast lump    1-2 cm knot L axilla during end of pregnancy  . SVD (spontaneous vaginal delivery)    x 2  . Tricuspid regurgitation     Past Surgical History:  Procedure Laterality Date  . LASIK Bilateral   . WISDOM TOOTH EXTRACTION      There were no vitals filed for this visit.                      [facility] Adult PT Treatment/Exercise - 04/29/20 0001      Exercises   Exercises Knee/Hip;Ankle;Other Exercises      Knee/Hip Exercises: Stretches   Sports administrator Both;3 reps;30 seconds    Quad Stretch Limitations prone      Knee/Hip Exercises: Aerobic   Recumbent Bike L3 x 7 min      Knee/Hip Exercises: Standing   Functional Squat 20 reps    Functional Squat Limitations GTB around knees 2x10      Knee/Hip Exercises: Supine   Bridges with Clamshell 20 reps      Knee/Hip Exercises: Sidelying   Hip ABduction 20 reps    Hip ABduction Limitations x10 reg, x10 ea circles cw, ccw      Manual Therapy   Manual Therapy Joint mobilization;Soft tissue mobilization    Soft tissue mobilization DTM/IASTM to bil distal  and lateral quad;                  PT Education - 04/29/20 1450    Education Details Reviewed HEP    Person(s) Educated Patient    Methods Demonstration;Explanation;Tactile cues;Verbal cues    Comprehension Verbalized understanding;Returned demonstration;Verbal cues required;Tactile cues required;Need further instruction            PT Short Term Goals - 04/20/20 1650      PT SHORT TERM GOAL #1   Title Pt will become independent with HEP.    Time 2    Period Weeks    Status New    Target Date 05/04/20      PT SHORT TERM GOAL #2   Title Pt will improve MMT of hip abductors by 1/2 MMT in order to demonstrate clinically significant improvement in strength.    Time 4    Period Weeks    Status New    Target Date 05/18/20             PT Long Term Goals - 04/20/20 1652      PT  LONG TERM GOAL #1   Title Pt will be able to squat, lift, and run without pain in order to demonstrate full functional return to household duties and physical fitness/exercise.    Time 8    Period Weeks    Status New    Target Date 06/15/20                 Plan - 04/29/20 1451    Clinical Impression Statement Pt req cuing for squat mechanics as well as mechanics for hip abd strength. it would be helpful to continued education on correct form for optimal benefit.  Focus on quad tightness with manual today, as well as improving strength for quads hips and glutes. HEP reviewed, Discussed importance of strengthening going forward in addition to running and cardio exercise.    Personal Factors and Comorbidities Time since onset of injury/illness/exacerbation    Examination-Activity Limitations Stairs;Squat;Other;Bend;Caring for Others    Examination-Participation Restrictions Other;Community Activity    Stability/Clinical Decision Making Stable/Uncomplicated    Rehab Potential Good    PT Frequency 2x / week    PT Duration 6 weeks    PT Treatment/Interventions ADLs/Self Care Home  Management;Electrical Stimulation;Moist Heat;Cryotherapy;Iontophoresis 4mg /ml Dexamethasone;Contrast Bath;Gait training;Stair training;Functional mobility training;Therapeutic activities;Therapeutic exercise;Balance training;Neuromuscular re-education;Patient/family education;Manual techniques;Passive range of motion;Dry needling;Joint Manipulations    Consulted and Agree with Plan of Care Patient           Patient will benefit from skilled therapeutic intervention in order to improve the following deficits and impairments:  Decreased mobility,Increased muscle spasms,Impaired flexibility,Decreased range of motion,Pain,Decreased activity tolerance  Visit Diagnosis: Pain in joint of left knee  Pain in joint of right knee  Muscle weakness (generalized)     Problem List Patient Active Problem List   Diagnosis Date Noted  . Breast lump 10/18/2018  . Pelvic and perineal pain 10/18/2018  . Lentigo 01/05/2018  . Melanocytic nevi of trunk 01/05/2018  . Hypotrichosis of eyelid 04/12/2016  . Mass of left axilla 04/12/2016  . Family history of early CAD 03/21/2015    Lyndee Hensen, PT, DPT 2:55 PM  04/29/20    [facility] [address] [place], [place], [address] Phone: [phone]   Fax:  [phone]  Name: Amanda Costa MRN: [id number] Date of Birth: [birth year]

## 2020-05-02 ENCOUNTER — Encounter: Payer: Self-pay | Admitting: Physical Therapy

## 2020-05-02 ENCOUNTER — Other Ambulatory Visit: Payer: Self-pay

## 2020-05-02 ENCOUNTER — Ambulatory Visit (INDEPENDENT_AMBULATORY_CARE_PROVIDER_SITE_OTHER): Payer: 59 | Admitting: Physical Therapy

## 2020-05-02 DIAGNOSIS — M25562 Pain in left knee: Secondary | ICD-10-CM

## 2020-05-02 DIAGNOSIS — M6281 Muscle weakness (generalized): Secondary | ICD-10-CM | POA: Diagnosis not present

## 2020-05-02 DIAGNOSIS — M25561 Pain in right knee: Secondary | ICD-10-CM | POA: Diagnosis not present

## 2020-05-02 NOTE — Patient Instructions (Signed)
Access Code: [email or web address] URL: [email or web address] Date: 05/02/2020 Prepared by: Daleen Bo  Exercises Hip Hiking on Step - 1 x daily - 3-4 x weekly - 3 sets - 10 reps - 2 hold Eccentric Heel Lowering on Step - 1 x daily - 3-4 x weekly - 3 sets - 10 reps

## 2020-05-02 NOTE — Therapy (Signed)
[facility] [address] [place], [place], [address] Phone: [phone]   Fax:  [phone]  Physical Therapy Treatment  Patient Details  Name: Amanda Costa MRN: [id number] Date of Birth: 1980-06-18 Referring Provider (PT): Dr. Georgina Snell   Encounter Date: 05/02/2020   PT End of Session - 05/02/20 1138    Visit Number 3    Number of Visits 13    Date for PT Re-Evaluation 05/20/20    Authorization Type [place]    PT Start Time 0930    PT Stop Time 1017    PT Time Calculation (min) 47 min    Activity Tolerance Patient tolerated treatment well    Behavior During Therapy [employer] for tasks assessed/performed           Past Medical History:  Diagnosis Date  . Cystic fibrosis carrier   . Family history of early CAD 03/21/2015   Mother and uncle. Pt had an ECHO many years ago.  . Left breast lump    1-2 cm knot L axilla during end of pregnancy  . SVD (spontaneous vaginal delivery)    x 2  . Tricuspid regurgitation     Past Surgical History:  Procedure Laterality Date  . LASIK Bilateral   . WISDOM TOOTH EXTRACTION      There were no vitals filed for this visit.   Subjective Assessment - 05/02/20 0933    Subjective Pt states that the pain is still with deep knee flexion and the pain has been more pronounced at night until she stretches and moves around. The last few days she has noticed more popping and clicking within the R knee.    Limitations Walking;Lifting    Currently in Pain? Yes    Pain Score 6     Pain Location Knee    Pain Orientation Left;Right    Pain Descriptors / Indicators Aching;Stabbing    Pain Type Acute pain    Pain Onset More than a month ago                             [facility] Adult PT Treatment/Exercise - 05/02/20 0001      Exercises   Exercises Knee/Hip;Ankle;Other Exercises      Knee/Hip Exercises: Stretches   Contractor Limitations --     Other Knee/Hip Stretches quad foam roll, ischemic pressure 2-3 mins      Knee/Hip Exercises: Aerobic   Recumbent Bike L3 x 7 min      Knee/Hip Exercises: Standing   Functional Squat 20 reps    Functional Squat Limitations GTB around knees 2x10    Other Standing Knee Exercises Hip hike at stair 2x10 each    Other Standing Knee Exercises eccentric heel drop 2x10      Knee/Hip Exercises: Seated   Long Arc Quad Right;Left;2 sets    Long Arc Quad Weight 5 lbs.    Long [employer] Limitations avoid end range      Knee/Hip Exercises: Supine   Bridges with Clamshell --      Knee/Hip Exercises: Sidelying   Hip ABduction 20 reps    Hip ABduction Limitations x10 reg, x10 ea circles cw, ccw      Manual Therapy   Manual Therapy Joint mobilization;Soft tissue mobilization    Soft tissue mobilization bilat quad, esp rec fem on R  PT Education - 05/02/20 0943    Education Details HEP review/update, avoiding agg factors, intensity/volume of loading, offloading    Person(s) Educated Patient    Methods Explanation;Demonstration;Verbal cues    Comprehension Verbalized understanding;Returned demonstration            PT Short Term Goals - 04/20/20 1650      PT SHORT TERM GOAL #1   Title Pt will become independent with HEP.    Time 2    Period Weeks    Status New    Target Date 05/04/20      PT SHORT TERM GOAL #2   Title Pt will improve MMT of hip abductors by 1/2 MMT in order to demonstrate clinically significant improvement in strength.    Time 4    Period Weeks    Status New    Target Date 05/18/20             PT Long Term Goals - 04/20/20 1652      PT LONG TERM GOAL #1   Title Pt will be able to squat, lift, and run without pain in order to demonstrate full functional return to household duties and physical fitness/exercise.    Time 8    Period Weeks    Status New    Target Date 06/15/20                 Plan - 05/02/20 1139    Clinical  Impression Statement Pt presents with increased R>>L quadriceps tone at today's session that was relieved with ischemic pressure STM and self massage. Pt presents with signficant quadriceps and hip ABD strength as demonstrated by quick onset of fatigue with BW hip hike and 5 lb LAQ. Pt demonstrated diminished eccentric knee flexion strength with tetany noted after 3-4 reps. Pt required introduction of triceps surae stretching in order to promote better bilat ankle DF and anterior knee translation during squatting. Likely need to revisit coordination/activation of glutes for hip extension during squatting. Pt would benefit from continued skilled therapy in order to reach goals, maximize LE strength, and prevent further decline for full return to PLOF.    Personal Factors and Comorbidities Time since onset of injury/illness/exacerbation    Examination-Activity Limitations Stairs;Squat;Other;Bend;Caring for Others    Examination-Participation Restrictions Other;Community Activity    Stability/Clinical Decision Making Stable/Uncomplicated    Rehab Potential Good    PT Frequency 2x / week    PT Duration 6 weeks    PT Treatment/Interventions ADLs/Self Care Home Management;Electrical Stimulation;Moist Heat;Cryotherapy;Iontophoresis 4mg /ml Dexamethasone;Contrast Bath;Gait training;Stair training;Functional mobility training;Therapeutic activities;Therapeutic exercise;Balance training;Neuromuscular re-education;Patient/family education;Manual techniques;Passive range of motion;Dry needling;Joint Manipulations    PT Next Visit Plan LAQ, trial kneeling hip extension, quad burner    Consulted and Agree with Plan of Care Patient           Patient will benefit from skilled therapeutic intervention in order to improve the following deficits and impairments:  Decreased mobility,Increased muscle spasms,Impaired flexibility,Decreased range of motion,Pain,Decreased activity tolerance  Visit Diagnosis: Pain in joint  of left knee  Pain in joint of right knee  Muscle weakness (generalized)     Problem List Patient Active Problem List   Diagnosis Date Noted  . Breast lump 10/18/2018  . Pelvic and perineal pain 10/18/2018  . Lentigo 01/05/2018  . Melanocytic nevi of trunk 01/05/2018  . Hypotrichosis of eyelid 04/12/2016  . Mass of left axilla 04/12/2016  . Family history of early CAD 03/21/2015    Daleen Bo PT, DPT  05/02/20 11:46 AM   [facility] [address] [place], [place], [address] Phone: [phone]   Fax:  [phone]  Name: SHARLENA KRISTENSEN MRN: [id number] Date of Birth: 1980/12/04

## 2020-05-04 ENCOUNTER — Encounter: Payer: Self-pay | Admitting: Physical Therapy

## 2020-05-04 ENCOUNTER — Other Ambulatory Visit: Payer: Self-pay

## 2020-05-04 ENCOUNTER — Ambulatory Visit (INDEPENDENT_AMBULATORY_CARE_PROVIDER_SITE_OTHER): Payer: 59 | Admitting: Physical Therapy

## 2020-05-04 DIAGNOSIS — M6281 Muscle weakness (generalized): Secondary | ICD-10-CM

## 2020-05-04 DIAGNOSIS — M25562 Pain in left knee: Secondary | ICD-10-CM

## 2020-05-04 DIAGNOSIS — M25561 Pain in right knee: Secondary | ICD-10-CM | POA: Diagnosis not present

## 2020-05-04 NOTE — Therapy (Signed)
[facility] [place], [place], [address] Phone: [phone]   Fax:  [phone]  Physical Therapy Treatment  Patient Details  Name: Amanda Costa MRN: [id number] Date of Birth: [birth year] Referring Provider (PT): Dr. Georgina Snell   Encounter Date: 05/04/2020   PT End of Session - 05/04/20 1110    Visit Number 4    Number of Visits 13    Date for PT Re-Evaluation 05/20/20    Authorization Type [place]    PT Start Time 0930    PT Stop Time 1015    PT Time Calculation (min) 45 min    Activity Tolerance Patient tolerated treatment well    Behavior During Therapy [facility] for tasks assessed/performed           Past Medical History:  Diagnosis Date  . Cystic fibrosis carrier   . Family history of early CAD 03/21/2015   Mother and uncle. Pt had an ECHO many years ago.  . Left breast lump    1-2 cm knot L axilla during end of pregnancy  . SVD (spontaneous vaginal delivery)    x 2  . Tricuspid regurgitation     Past Surgical History:  Procedure Laterality Date  . LASIK Bilateral   . WISDOM TOOTH EXTRACTION      There were no vitals filed for this visit.   Subjective Assessment - 05/04/20 0934    Subjective Pt states the knee pain is still very similar. She states she was able to sleep better with less frequency of pain but the peak is still at a 7. She states stairs and kneeling still bother it.    Limitations Walking;Lifting    Currently in Pain? Yes    Pain Score 5     Pain Location Knee    Pain Orientation Left;Right    Pain Descriptors / Indicators Aching;Stabbing    Pain Type Acute pain    Pain Onset More than a month ago    Pain Frequency Intermittent                             [facility] Adult PT Treatment/Exercise - 05/04/20 0001      Exercises   Exercises Knee/Hip;Ankle;Other Exercises      Knee/Hip Exercises: Stretches   Other Knee/Hip Stretches quad foam roll, ischemic  pressure 2-3 mins      Knee/Hip Exercises: Aerobic   Recumbent Bike L3 x 7 min      Knee/Hip Exercises: Standing   Functional Squat 10 reps    Functional Squat Limitations at mirror, cuing for hip and knee angle    Other Standing Knee Exercises wall sit to 90 30s 3x    Other Standing Knee Exercises eccentric heel drop 2x10, glute med wall lean 30s 2x      Knee/Hip Exercises: Seated   Long Arc Quad Right;Left;3 sets    Long Arc Quad Weight 5 lbs.    Long [employer] Limitations avoid end range, 3x10      Knee/Hip Exercises: Supine   Single Leg Bridge 3 sets;10 reps      Knee/Hip Exercises: Sidelying   Hip ABduction --    Hip ABduction Limitations --      Manual Therapy   Manual Therapy Joint mobilization;Soft tissue mobilization    Soft tissue mobilization L biceps fem, lateral head gastroc  PT Education - 05/04/20 1109    Education Details HEP review/update, avoiding agg factors, intensity/volume of loading, increasing cadence with running, intervals, envelope of function    Person(s) Educated Patient    Methods Explanation;Demonstration    Comprehension Verbalized understanding;Returned demonstration            PT Short Term Goals - 04/20/20 1650      PT SHORT TERM GOAL #1   Title Pt will become independent with HEP.    Time 2    Period Weeks    Status New    Target Date 05/04/20      PT SHORT TERM GOAL #2   Title Pt will improve MMT of hip abductors by 1/2 MMT in order to demonstrate clinically significant improvement in strength.    Time 4    Period Weeks    Status New    Target Date 05/18/20             PT Long Term Goals - 04/20/20 1652      PT LONG TERM GOAL #1   Title Pt will be able to squat, lift, and run without pain in order to demonstrate full functional return to household duties and physical fitness/exercise.    Time 8    Period Weeks    Status New    Target Date 06/15/20                 Plan - 05/04/20  1110    Clinical Impression Statement Pt presented with increased L biceps femoris pain at today's session that was relieved with manual therapy and pt was able to continue with exercise following. Caused likely by knee hyperextension and pt report of standing heel raise going into calcaneal inversion. Pt continues to show significant bilateral quadriceps and lateral hip endurance/strength deficits that are contributing to knee pain. Pt is progressing but improvement is in small increments. Subsequent sessions to focus on unilateral quad and hip ABD strengthening if there is no new onset of pain. Pt would benefit from continued skilled therapy in order to reach goals, maximize LE strength, and prevent further decline for full return to PLOF.    Personal Factors and Comorbidities Time since onset of injury/illness/exacerbation    Examination-Activity Limitations Stairs;Squat;Other;Bend;Caring for Others    Examination-Participation Restrictions Other;Community Activity    Stability/Clinical Decision Making Stable/Uncomplicated    Rehab Potential Good    PT Frequency 2x / week    PT Duration 6 weeks    PT Treatment/Interventions ADLs/Self Care Home Management;Electrical Stimulation;Moist Heat;Cryotherapy;Iontophoresis 4mg /ml Dexamethasone;Contrast Bath;Gait training;Stair training;Functional mobility training;Therapeutic activities;Therapeutic exercise;Balance training;Neuromuscular re-education;Patient/family education;Manual techniques;Passive range of motion;Dry needling;Joint Manipulations    PT Next Visit Plan SL wall sit, trial kneeling hip extension, quad burner, manual resisted LAQ    Consulted and Agree with Plan of Care Patient           Patient will benefit from skilled therapeutic intervention in order to improve the following deficits and impairments:  Decreased mobility,Increased muscle spasms,Impaired flexibility,Decreased range of motion,Pain,Decreased activity tolerance  Visit  Diagnosis: Pain in joint of left knee  Pain in joint of right knee  Muscle weakness (generalized)     Problem List Patient Active Problem List   Diagnosis Date Noted  . Breast lump 10/18/2018  . Pelvic and perineal pain 10/18/2018  . Lentigo 01/05/2018  . Melanocytic nevi of trunk 01/05/2018  . Hypotrichosis of eyelid 04/12/2016  . Mass of left axilla 04/12/2016  . Family history of early  CAD 03/21/2015    Daleen Bo PT, DPT 05/04/20 11:16 AM   [facility] [address] [place], [place], [address] Phone: [phone]   Fax:  [phone]  Name: Amanda Costa MRN: [id number] Date of Birth: 03-Sep-1980

## 2020-05-04 NOTE — Patient Instructions (Signed)
Add LAQ, remove wall sit

## 2020-05-08 ENCOUNTER — Encounter: Payer: 59 | Admitting: Physical Therapy

## 2020-05-11 ENCOUNTER — Ambulatory Visit (INDEPENDENT_AMBULATORY_CARE_PROVIDER_SITE_OTHER): Payer: 59 | Admitting: Physical Therapy

## 2020-05-11 ENCOUNTER — Encounter: Payer: Self-pay | Admitting: Physical Therapy

## 2020-05-11 DIAGNOSIS — M6281 Muscle weakness (generalized): Secondary | ICD-10-CM

## 2020-05-11 DIAGNOSIS — M25562 Pain in left knee: Secondary | ICD-10-CM | POA: Diagnosis not present

## 2020-05-11 DIAGNOSIS — M25561 Pain in right knee: Secondary | ICD-10-CM

## 2020-05-11 NOTE — Therapy (Signed)
[facility] [address] [place], [place], [address] Phone: [phone]   Fax:  [phone]  Physical Therapy Treatment  Patient Details  Name: Amanda Costa MRN: [id number] Date of Birth: February 01, 1981 Referring Provider (PT): Dr. Georgina Snell   Encounter Date: 05/11/2020   PT End of Session - 05/11/20 1157    Visit Number 5    Number of Visits 13    Date for PT Re-Evaluation 05/20/20    Authorization Type [place]    PT Start Time 1103    PT Stop Time 1145    PT Time Calculation (min) 42 min    Activity Tolerance Patient tolerated treatment well    Behavior During Therapy [facility] for tasks assessed/performed           Past Medical History:  Diagnosis Date  . Cystic fibrosis carrier   . Family history of early CAD 03/21/2015   Mother and uncle. Pt had an ECHO many years ago.  . Left breast lump    1-2 cm knot L axilla during end of pregnancy  . SVD (spontaneous vaginal delivery)    x 2  . Tricuspid regurgitation     Past Surgical History:  Procedure Laterality Date  . LASIK Bilateral   . WISDOM TOOTH EXTRACTION      There were no vitals filed for this visit.   Subjective Assessment - 05/11/20 1106    Subjective Pt states the knees feel a little better and at baseline compared to last session. She states the pain has decreased since last session. Pt states the increaesd cadence helped with running. She did some continous runing as well. 4/10 at worst 0/10 at best.    Limitations Walking;Lifting    Currently in Pain? No/denies    Pain Score 0-No pain    Pain Location Knee    Pain Orientation Left;Right;Medial    Pain Descriptors / Indicators Aching;Sharp;Stabbing    Pain Onset More than a month ago                             [facility] Adult PT Treatment/Exercise - 05/11/20 0001      Exercises   Exercises Knee/Hip;Ankle;Other Exercises      Knee/Hip Exercises: Stretches   Other Knee/Hip  Stretches kneeling hip flexor stretch 30s 3x      Knee/Hip Exercises: Aerobic   Recumbent Bike --      Knee/Hip Exercises: Standing   Functional Squat --    Functional Squat Limitations --    Other Standing Knee Exercises SL heel tap at stair 2x10 each, hip dominant movement    Other Standing Knee Exercises kneeling hip extension, 10x, trialed but on hold due to pain      Knee/Hip Exercises: Seated   Long Arc Quad Both;5 reps    Long Arc Quad Weight 2 lbs.    Long [employer] Limitations quad burner hold in long sit, slight ER      Knee/Hip Exercises: Supine   Single Leg Bridge --      Manual Therapy   Manual Therapy Joint mobilization;Soft tissue mobilization    Soft tissue mobilization bilat patellar mob grade II, STM bilat rec fem and quadriceps                  PT Education - 05/11/20 1159    Education Details HEP review, avoiding agg factors, intensity/volume of loading, increasing cadence with running,  envelope of function, strength progression, POC, PRN DN    Person(s) Educated Patient    Methods Explanation;Demonstration;Handout    Comprehension Verbalized understanding;Returned demonstration            PT Short Term Goals - 04/20/20 1650      PT SHORT TERM GOAL #1   Title Pt will become independent with HEP.    Time 2    Period Weeks    Status New    Target Date 05/04/20      PT SHORT TERM GOAL #2   Title Pt will improve MMT of hip abductors by 1/2 MMT in order to demonstrate clinically significant improvement in strength.    Time 4    Period Weeks    Status New    Target Date 05/18/20             PT Long Term Goals - 04/20/20 1652      PT LONG TERM GOAL #1   Title Pt will be able to squat, lift, and run without pain in order to demonstrate full functional return to household duties and physical fitness/exercise.    Time 8    Period Weeks    Status New    Target Date 06/15/20                 Plan - 05/11/20 1200    Clinical  Impression Statement Pt presented with decreased pain sx at beginning of today's session but still has sensitivity with TKE quad contraction and deep knee flexion. Pt had bilateral rec fem and VL hypertonicity despite normal knee flexion ROM. Pt demonstrated endurance deficits with isometric knee extension and flexion holds as well as knee strength deficits with SL heel tapping. Pt gave verbal understanding to potential introduction of DN at subsequent sessions with other therapist for potential relief of chronic quadriceps hypertonicity. Pt would benefit from continued skilled therapy in order to reach goals, maximize LE strength, and prevent further decline for full return to PLOF.    Personal Factors and Comorbidities Time since onset of injury/illness/exacerbation    Examination-Activity Limitations Stairs;Squat;Other;Bend;Caring for Others    Examination-Participation Restrictions Other;Community Activity    Stability/Clinical Decision Making Stable/Uncomplicated    Rehab Potential Good    PT Frequency 2x / week    PT Duration 6 weeks    PT Treatment/Interventions ADLs/Self Care Home Management;Electrical Stimulation;Moist Heat;Cryotherapy;Iontophoresis 4mg /ml Dexamethasone;Contrast Bath;Gait training;Stair training;Functional mobility training;Therapeutic activities;Therapeutic exercise;Balance training;Neuromuscular re-education;Patient/family education;Manual techniques;Passive range of motion;Dry needling;Joint Manipulations    PT Next Visit Plan SL wall sit, quad burner with increased weight, lateral lunge, SLS on foam with reach, runner's step up    Consulted and Agree with Plan of Care Patient           Patient will benefit from skilled therapeutic intervention in order to improve the following deficits and impairments:  Decreased mobility,Increased muscle spasms,Impaired flexibility,Decreased range of motion,Pain,Decreased activity tolerance  Visit Diagnosis: Pain in joint of left  knee  Pain in joint of right knee  Muscle weakness (generalized)     Problem List Patient Active Problem List   Diagnosis Date Noted  . Breast lump 10/18/2018  . Pelvic and perineal pain 10/18/2018  . Lentigo 01/05/2018  . Melanocytic nevi of trunk 01/05/2018  . Hypotrichosis of eyelid 04/12/2016  . Mass of left axilla 04/12/2016  . Family history of early CAD 03/21/2015    Daleen Bo PT, DPT 05/11/20 12:12 PM   [facility] [phone]  [place], [place], [address] Phone: [phone]   Fax:  [phone]  Name: Amanda Costa MRN: [id number] Date of Birth: [birth year]

## 2020-05-17 ENCOUNTER — Encounter: Payer: Self-pay | Admitting: Physical Therapy

## 2020-05-17 ENCOUNTER — Other Ambulatory Visit: Payer: Self-pay

## 2020-05-17 ENCOUNTER — Telehealth: Payer: Self-pay | Admitting: General Practice

## 2020-05-17 ENCOUNTER — Ambulatory Visit (INDEPENDENT_AMBULATORY_CARE_PROVIDER_SITE_OTHER): Payer: 59 | Admitting: Physical Therapy

## 2020-05-17 DIAGNOSIS — M6281 Muscle weakness (generalized): Secondary | ICD-10-CM | POA: Diagnosis not present

## 2020-05-17 DIAGNOSIS — M25562 Pain in left knee: Secondary | ICD-10-CM

## 2020-05-17 DIAGNOSIS — M25561 Pain in right knee: Secondary | ICD-10-CM | POA: Diagnosis not present

## 2020-05-17 NOTE — Therapy (Signed)
[facility] [address] [place], [place], [address] Phone: [phone]   Fax:  [phone]  Physical Therapy Treatment  Patient Details  Name: Amanda Costa MRN: [id number] Date of Birth: 03-22-1980 Referring Provider (PT): Dr. Georgina Snell   Encounter Date: 05/17/2020   PT End of Session - 05/17/20 1210    Visit Number 6    Number of Visits 13    Date for PT Re-Evaluation 05/20/20    Authorization Type [place]    PT Start Time 0930    PT Stop Time 1013    PT Time Calculation (min) 43 min    Activity Tolerance Patient tolerated treatment well    Behavior During Therapy [facility] for tasks assessed/performed           Past Medical History:  Diagnosis Date  . Cystic fibrosis carrier   . Family history of early CAD 03/21/2015   Mother and uncle. Pt had an ECHO many years ago.  . Left breast lump    1-2 cm knot L axilla during end of pregnancy  . SVD (spontaneous vaginal delivery)    x 2  . Tricuspid regurgitation     Past Surgical History:  Procedure Laterality Date  . LASIK Bilateral   . WISDOM TOOTH EXTRACTION      There were no vitals filed for this visit.   Subjective Assessment - 05/17/20 1209    Subjective Pt states mid pain, mostly with daily activites around the house. She has been able to run up to 3.5 mi, and do HEP without much pain.    Currently in Pain? No/denies    Pain Score 0-No pain                             [facility] Adult PT Treatment/Exercise - 05/17/20 0001      Knee/Hip Exercises: Stretches   Other Knee/Hip Stretches kneeling hip flexor stretch 30s 3x      Knee/Hip Exercises: Standing   Other Standing Knee Exercises SL heel tap at stair x10 each, hip dominant movement    Other Standing Knee Exercises SL stability, runners lunge x 10 bil;      Knee/Hip Exercises: Supine   Bridges with Clamshell 20 reps      Knee/Hip Exercises: Prone   Other Prone Exercises hip  ext/bird dog on PBall x20;      Manual Therapy   Manual therapy comments skilled palpation and monitoring of soft tissue with dry needling.    Soft tissue mobilization IASTM to bil quads            Trigger Point Dry Needling - 05/17/20 0001    Consent Given? Yes    Education Handout Provided Yes    Muscles Treated Lower Quadrant Rectus femoris;Vastus lateralis;Vastus medialis    Rectus femoris Response Palpable increased muscle length   Bil   Vastus lateralis Response Twitch response elicited;Palpable increased muscle length   Bil   Vastus medialis Response Palpable increased muscle length   Bil               PT Education - 05/17/20 1209    Education Details Education on dry needling use, benefits, risks,    Person(s) Educated Patient    Methods Explanation;Demonstration;Verbal cues;Handout    Comprehension Verbalized understanding;Returned demonstration;Verbal cues required;Need further instruction            PT Short Term Goals - 04/20/20  [place] #1   Title Pt will become independent with HEP.    Time 2    Period Weeks    Status New    Target Date 05/04/20      PT SHORT TERM GOAL #2   Title Pt will improve MMT of hip abductors by 1/2 MMT in order to demonstrate clinically significant improvement in strength.    Time 4    Period Weeks    Status New    Target Date 05/18/20             PT Long Term Goals - 04/20/20 1652      PT LONG TERM GOAL #1   Title Pt will be able to squat, lift, and run without pain in order to demonstrate full functional return to household duties and physical fitness/exercise.    Time 8    Period Weeks    Status New    Target Date 06/15/20                 Plan - 05/17/20 1217    Clinical Impression Statement Pt with hypertonicity in bil quads, addressed with dry needling. Pt with good tolerance, may benefit from future sessions as needed. Pt with minimal soreness with strengthening exercises  today, Pt to benefit from progressive strengthening as tolerated.    Personal Factors and Comorbidities Time since onset of injury/illness/exacerbation    Examination-Activity Limitations Stairs;Squat;Other;Bend;Caring for Others    Examination-Participation Restrictions Other;Community Activity    Stability/Clinical Decision Making Stable/Uncomplicated    Rehab Potential Good    PT Frequency 2x / week    PT Duration 6 weeks    PT Treatment/Interventions ADLs/Self Care Home Management;Electrical Stimulation;Moist Heat;Cryotherapy;Iontophoresis 4mg /ml Dexamethasone;Contrast Bath;Gait training;Stair training;Functional mobility training;Therapeutic activities;Therapeutic exercise;Balance training;Neuromuscular re-education;Patient/family education;Manual techniques;Passive range of motion;Dry needling;Joint Manipulations    PT Next Visit Plan SL wall sit, quad burner with increased weight, lateral lunge, SLS on foam with reach, runner's step up    Consulted and Agree with Plan of Care Patient           Patient will benefit from skilled therapeutic intervention in order to improve the following deficits and impairments:  Decreased mobility,Increased muscle spasms,Impaired flexibility,Decreased range of motion,Pain,Decreased activity tolerance  Visit Diagnosis: Pain in joint of left knee  Pain in joint of right knee  Muscle weakness (generalized)     Problem List Patient Active Problem List   Diagnosis Date Noted  . Breast lump 10/18/2018  . Pelvic and perineal pain 10/18/2018  . Lentigo 01/05/2018  . Melanocytic nevi of trunk 01/05/2018  . Hypotrichosis of eyelid 04/12/2016  . Mass of left axilla 04/12/2016  . Family history of early CAD 03/21/2015    Lyndee Hensen, PT, DPT 1:31 PM  05/17/20    [facility] [address] [place], [place], [address] Phone: [phone]   Fax:  [phone]  Name: Amanda Costa MRN:  [id number] Date of Birth: 04/15/1980

## 2020-05-17 NOTE — Telephone Encounter (Signed)
Yes I agreed to accept

## 2020-05-17 NOTE — Telephone Encounter (Signed)
Patient called and said that her husband, Amanda Costa spoke with you about her being a new pt. Please advise

## 2020-05-19 ENCOUNTER — Other Ambulatory Visit: Payer: Self-pay

## 2020-05-19 ENCOUNTER — Ambulatory Visit (INDEPENDENT_AMBULATORY_CARE_PROVIDER_SITE_OTHER): Payer: 59 | Admitting: Physical Therapy

## 2020-05-19 DIAGNOSIS — M25561 Pain in right knee: Secondary | ICD-10-CM

## 2020-05-19 DIAGNOSIS — M25562 Pain in left knee: Secondary | ICD-10-CM

## 2020-05-19 DIAGNOSIS — M6281 Muscle weakness (generalized): Secondary | ICD-10-CM

## 2020-05-19 NOTE — Therapy (Signed)
[facility] [address] [place], [place], [address] Phone: [phone]   Fax:  [phone]  Physical Therapy Treatment  Patient Details  Name: Amanda Costa MRN: [id number] Date of Birth: 04/18/1980 Referring Provider (PT): Dr. Georgina Snell   Encounter Date: 05/19/2020   PT End of Session - 05/19/20 1001    Visit Number 7    Number of Visits 13    Date for PT Re-Evaluation 05/20/20    Authorization Type [place]    PT Start Time 0930    PT Stop Time 1020    PT Time Calculation (min) 50 min    Activity Tolerance Patient tolerated treatment well    Behavior During Therapy [facility] for tasks assessed/performed           Past Medical History:  Diagnosis Date  . Cystic fibrosis carrier   . Family history of early CAD 03/21/2015   Mother and uncle. Pt had an ECHO many years ago.  . Left breast lump    1-2 cm knot L axilla during end of pregnancy  . SVD (spontaneous vaginal delivery)    x 2  . Tricuspid regurgitation     Past Surgical History:  Procedure Laterality Date  . LASIK Bilateral   . WISDOM TOOTH EXTRACTION      There were no vitals filed for this visit.   Subjective Assessment - 05/19/20 0938    Subjective Pt states she felt much better after DN last session. The day after, her knees felt good. Today, they are little more sore again. She was able to do a short run after DN without pain.    Currently in Pain? No/denies    Pain Score 0-No pain                             [facility] Adult PT Treatment/Exercise - 05/19/20 0001      Knee/Hip Exercises: Stretches   Other Knee/Hip Stretches kneeling TC self mob- blue TB 5 min      Knee/Hip Exercises: Aerobic   Tread Mill running/gait video analysis with pt      Knee/Hip Exercises: Standing   Other Standing Knee Exercises SL heel tap at stair x10 each, hip dominant movement; side step RTB toe box YTB knees 17ft    Other Standing Knee  Exercises SL stability, runners lunge x 10 bil;   no UE support     Knee/Hip Exercises: Supine   Bridges with Clamshell --    Single Leg Bridge 20 reps      Knee/Hip Exercises: Prone   Other Prone Exercises --      Manual Therapy   Manual therapy comments --    Soft tissue mobilization STM bilat rec fem and esp VL                  PT Education - 05/19/20 0958    Education Details HEP, POC, DN PRN, running/gait mechanics, footwear affects on running    Person(s) Educated Patient    Methods Explanation;Demonstration;Tactile cues;Verbal cues    Comprehension Verbalized understanding;Returned demonstration            PT Short Term Goals - 04/20/20 1650      PT SHORT TERM GOAL #1   Title Pt will become independent with HEP.    Time 2    Period Weeks    Status New    Target Date 05/04/20  PT SHORT TERM GOAL #2   Title Pt will improve MMT of hip abductors by 1/2 MMT in order to demonstrate clinically significant improvement in strength.    Time 4    Period Weeks    Status New    Target Date 05/18/20             PT Long Term Goals - 04/20/20 1652      PT LONG TERM GOAL #1   Title Pt will be able to squat, lift, and run without pain in order to demonstrate full functional return to household duties and physical fitness/exercise.    Time 8    Period Weeks    Status New    Target Date 06/15/20                 Plan - 05/19/20 1055    Clinical Impression Statement Pt demonstrated increased bilat VL tone at today's session that was relieved with STM. Localized twitch response was elicited with manual technique. Pt reported decreased in pain/soreness following. During gait analysis, pt demonstrated mod motor control limitation at the hip and possible ROM restrictions at the ankle that are exacerbating PFPS. Pt with decreased frontal plane knee motor control and increased ankle stiffness. Pt also had slight incease in superior COG displacement with running  that could be increasing impact loading per stride. Pt gave verbal edu re education with video analysis and possible strategies to improving running quality. Pt to focus on strengthening, ankle ROM, and potential trial of heel lift in order to mitigate patellofemoral pain with exercise and running gait. Pt would benefit from continued skilled therapy in order to maximize functional bilat LE strength for prevention of further decline and full return to PLOF and exercise/fitness.    Personal Factors and Comorbidities Time since onset of injury/illness/exacerbation    Examination-Activity Limitations Stairs;Squat;Other;Bend;Caring for Others    Examination-Participation Restrictions Other;Community Activity    Stability/Clinical Decision Making Stable/Uncomplicated    Rehab Potential Good    PT Frequency 2x / week    PT Duration 6 weeks    PT Treatment/Interventions ADLs/Self Care Home Management;Electrical Stimulation;Moist Heat;Cryotherapy;Iontophoresis 4mg /ml Dexamethasone;Contrast Bath;Gait training;Stair training;Functional mobility training;Therapeutic activities;Therapeutic exercise;Balance training;Neuromuscular re-education;Patient/family education;Manual techniques;Passive range of motion;Dry needling;Joint Manipulations    PT Next Visit Plan SL wall sit, lateral lunge, SLS on foam with reach, runner's step up, SL RDL with weight    PT Home Exercise Plan [email or web address]    Consulted and Agree with Plan of Care Patient           Patient will benefit from skilled therapeutic intervention in order to improve the following deficits and impairments:  Decreased mobility,Increased muscle spasms,Impaired flexibility,Decreased range of motion,Pain,Decreased activity tolerance  Visit Diagnosis: Pain in joint of left knee  Pain in joint of right knee  Muscle weakness (generalized)     Problem List Patient Active Problem List   Diagnosis Date Noted  . Breast lump 10/18/2018  . Pelvic and  perineal pain 10/18/2018  . Lentigo 01/05/2018  . Melanocytic nevi of trunk 01/05/2018  . Hypotrichosis of eyelid 04/12/2016  . Mass of left axilla 04/12/2016  . Family history of early CAD 03/21/2015    Amanda Costa PT, DPT 05/19/20 11:30 AM   [facility] [address] [place], [place], [address] Phone: [phone]   Fax:  [phone]  Name: Amanda Costa MRN: [id number] Date of Birth: 07-01-1980

## 2020-05-19 NOTE — Therapy (Deleted)
[facility] [address] [place], [place], [address] Phone: [phone]   Fax:  [phone]  Physical Therapy Treatment  Patient Details  Name: Amanda Costa MRN: [id number] Date of Birth: 11/29/1980 Referring Provider (PT): Dr. Georgina Snell   Encounter Date: 05/19/2020   PT End of Session - 05/19/20 1001    Visit Number 7    Number of Visits 13    Date for PT Re-Evaluation 05/20/20    Authorization Type [place]    PT Start Time 0930    PT Stop Time 1020    PT Time Calculation (min) 50 min    Activity Tolerance Patient tolerated treatment well    Behavior During Therapy [facility] for tasks assessed/performed           Past Medical History:  Diagnosis Date  . Cystic fibrosis carrier   . Family history of early CAD 03/21/2015   Mother and uncle. Pt had an ECHO many years ago.  . Left breast lump    1-2 cm knot L axilla during end of pregnancy  . SVD (spontaneous vaginal delivery)    x 2  . Tricuspid regurgitation     Past Surgical History:  Procedure Laterality Date  . LASIK Bilateral   . WISDOM TOOTH EXTRACTION      There were no vitals filed for this visit.   Subjective Assessment - 05/19/20 0938    Subjective Pt states she felt much better after DN last session. The day after, her knees felt good. Today, they are little more sore again. She was able to do a short run after DN without pain.    Currently in Pain? No/denies    Pain Score 0-No pain                             [facility] Adult PT Treatment/Exercise - 05/19/20 0001      Knee/Hip Exercises: Stretches   Other Knee/Hip Stretches kneeling TC self mob- blue TB 5 min      Knee/Hip Exercises: Aerobic   Tread Mill running/gait video analysis with pt      Knee/Hip Exercises: Standing   Other Standing Knee Exercises SL heel tap at stair x10 each, hip dominant movement; side step RTB toe box YTB knees 23ft    Other Standing Knee  Exercises SL stability, runners lunge x 10 bil;   no UE support     Knee/Hip Exercises: Supine   Bridges with Clamshell --    Single Leg Bridge 20 reps      Knee/Hip Exercises: Prone   Other Prone Exercises --      Manual Therapy   Manual therapy comments --    Soft tissue mobilization STM bilat rec fem and esp VL                  PT Education - 05/19/20 0958    Education Details HEP, POC, DN PRN, running/gait mechanics, footwear affects on running    Person(s) Educated Patient    Methods Explanation;Demonstration;Tactile cues;Verbal cues    Comprehension Verbalized understanding;Returned demonstration            PT Short Term Goals - 04/20/20 1650      PT SHORT TERM GOAL #1   Title Pt will become independent with HEP.    Time 2    Period Weeks    Status New    Target Date 05/04/20  PT SHORT TERM GOAL #2   Title Pt will improve MMT of hip abductors by 1/2 MMT in order to demonstrate clinically significant improvement in strength.    Time 4    Period Weeks    Status New    Target Date 05/18/20             PT Long Term Goals - 04/20/20 1652      PT LONG TERM GOAL #1   Title Pt will be able to squat, lift, and run without pain in order to demonstrate full functional return to household duties and physical fitness/exercise.    Time 8    Period Weeks    Status New    Target Date 06/15/20                 Plan - 05/19/20 1055    Clinical Impression Statement Pt demonstrated increased bilat VL tone at today's session that was relieved with STM. Localized twitch response was elicited with manual technique. Pt reported decreased in pain/soreness following. During gait analysis, pt demonstrated mod motor control limitation at the hip and possible ROM restrictions at the ankle that are exacerbating PFPS. Pt with decreased frontal plane knee motor control and increased ankle stiffness. Pt also had slight incease in superior COG displacement with running  that could be increasing impact loading per stride. Pt gave verbal edu re education with video analysis and possible strategies to improving running quality. Pt to focus on strengthening, ankle ROM, and potential trial of heel lift in order to mitigate patellofemoral pain with exercise and running gait. Pt would benefit from continued skilled therapy in order to maximize functional bilat LE strength for prevention of further decline and full return to PLOF and exercise/fitness.    Personal Factors and Comorbidities Time since onset of injury/illness/exacerbation    Examination-Activity Limitations Stairs;Squat;Other;Bend;Caring for Others    Examination-Participation Restrictions Other;Community Activity    Stability/Clinical Decision Making Stable/Uncomplicated    Rehab Potential Good    PT Frequency 2x / week    PT Duration 6 weeks    PT Treatment/Interventions ADLs/Self Care Home Management;Electrical Stimulation;Moist Heat;Cryotherapy;Iontophoresis 4mg /ml Dexamethasone;Contrast Bath;Gait training;Stair training;Functional mobility training;Therapeutic activities;Therapeutic exercise;Balance training;Neuromuscular re-education;Patient/family education;Manual techniques;Passive range of motion;Dry needling;Joint Manipulations    PT Next Visit Plan SL wall sit, lateral lunge, SLS on foam with reach, runner's step up, SL RDL with weight    Consulted and Agree with Plan of Care Patient           Patient will benefit from skilled therapeutic intervention in order to improve the following deficits and impairments:  Decreased mobility,Increased muscle spasms,Impaired flexibility,Decreased range of motion,Pain,Decreased activity tolerance  Visit Diagnosis: Pain in joint of left knee  Pain in joint of right knee  Muscle weakness (generalized)     Problem List Patient Active Problem List   Diagnosis Date Noted  . Breast lump 10/18/2018  . Pelvic and perineal pain 10/18/2018  . Lentigo  01/05/2018  . Melanocytic nevi of trunk 01/05/2018  . Hypotrichosis of eyelid 04/12/2016  . Mass of left axilla 04/12/2016  . Family history of early CAD 03/21/2015   Daleen Bo PT, DPT 05/19/20 11:24 AM   [facility] [address] [place], [place], [address] Phone: [phone]   Fax:  [phone]  Name: MALAYJAH OTOOLE MRN: [id number] Date of Birth: [birth year]

## 2020-05-19 NOTE — Patient Instructions (Signed)
Access Code: [facility] URL: [email or web address] Date: 05/19/2020 Prepared by: Daleen Bo  Exercises Prone Quadriceps Stretch with Strap - 2 x daily - 7 x weekly - 1 sets - 3 reps - 30 hold Wall Squat - 2 x daily - 3-4 x weekly - 2 sets - 5 reps - 30 hold Squat with Chair Touch and Resistance Loop - 1 x daily - 3-4 x weekly - 3 sets - 10 reps Quadriceps Mobilization with Foam Roll - 1 x daily - 7 x weekly - 1 sets - 1 reps - 31min hold Single Leg Bridge - 1 x daily - 3-4 x weekly - 3 sets - 10 reps Lateral Step Down - 1 x daily - 3-4 x weekly - 3 sets - 10 reps Sidestepping with band at knees and toe box** - 1 x daily - 3-4 x weekly - 1 sets - 2 reps - 50ft hold Sitting Knee Extension with Resistance - 1 x daily - 3-4 x weekly - 3 sets - 10 reps

## 2020-05-22 ENCOUNTER — Other Ambulatory Visit: Payer: Self-pay

## 2020-05-22 ENCOUNTER — Encounter: Payer: Self-pay | Admitting: Physical Therapy

## 2020-05-22 ENCOUNTER — Ambulatory Visit (INDEPENDENT_AMBULATORY_CARE_PROVIDER_SITE_OTHER): Payer: 59 | Admitting: Physical Therapy

## 2020-05-22 DIAGNOSIS — M25562 Pain in left knee: Secondary | ICD-10-CM

## 2020-05-22 DIAGNOSIS — M6281 Muscle weakness (generalized): Secondary | ICD-10-CM | POA: Diagnosis not present

## 2020-05-22 DIAGNOSIS — M25561 Pain in right knee: Secondary | ICD-10-CM

## 2020-05-22 NOTE — Therapy (Signed)
[facility] [address] [place], [place], [address] Phone: [phone]   Fax:  [phone]  Physical Therapy Treatment/Re-Cert   Patient Details  Name: Amanda Costa MRN: [id number] Date of Birth: Nov 28, 1980 Referring Provider (PT): Dr. Georgina Snell   Encounter Date: 05/22/2020   PT End of Session - 05/22/20 1650    Visit Number 8    Number of Visits 20    Date for PT Re-Evaluation 07/03/20    Authorization Type [place] Time Period Recert done at visit 8 : 05/22/20    PT Start Time 0938    PT Stop Time 1018    PT Time Calculation (min) 40 min    Activity Tolerance Patient tolerated treatment well    Behavior During Therapy [facility] for tasks assessed/performed           Past Medical History:  Diagnosis Date  . Cystic fibrosis carrier   . Family history of early CAD 03/21/2015   Mother and uncle. Pt had an ECHO many years ago.  . Left breast lump    1-2 cm knot L axilla during end of pregnancy  . SVD (spontaneous vaginal delivery)    x 2  . Tricuspid regurgitation     Past Surgical History:  Procedure Laterality Date  . LASIK Bilateral   . WISDOM TOOTH EXTRACTION      There were no vitals filed for this visit.   Subjective Assessment - 05/22/20 1649    Subjective Pt stats feeling great after last session, was able to run and be pain free for the whole day. Went to concert on Saturday and has had baseline pain in bil knees since then.    Currently in Pain? Yes    Pain Score 3     Pain Location Knee    Pain Orientation Right;Left    Pain Descriptors / Indicators Aching    Pain Type Acute pain    Pain Onset More than a month ago    Pain Frequency Intermittent              [facility] PT Assessment - 05/22/20 0001      ROM / Strength   AROM / PROM / Strength Strength      Strength   Overall Strength Comments hip flex: 4+/5, Abd/ext : 4/5                         [facility]  Adult PT Treatment/Exercise - 05/22/20 0001      Knee/Hip Exercises: Stretches   Hip Flexor Stretch 3 reps;30 seconds    Hip Flexor Stretch Limitations kneeling on AirEx      Knee/Hip Exercises: Standing   Functional Squat 20 reps    Functional Squat Limitations GTB at thighs, education on form    Other Standing Knee Exercises Side step/squat with GTB at thighs 25 ft x 6    Other Standing Knee Exercises SL stability, runners lunge x 10 bil;      Manual Therapy   Manual therapy comments skilled palpation and monitoring of soft tissue with dry needling.    Soft tissue mobilization DTM and IASTM to bil rec fem and VL.            Trigger Point Dry Needling - 05/22/20 0001    Consent Given? Yes    Education Handout Provided Previously provided    Rectus femoris Response Twitch response elicited;Palpable increased muscle  length   bil   Vastus lateralis Response Twitch response elicited;Palpable increased muscle length   bil                 PT Short Term Goals - 05/22/20 1658      PT SHORT TERM GOAL #1   Title Pt will become independent with HEP.    Time 2    Period Weeks    Status Achieved    Target Date 05/04/20      PT SHORT TERM GOAL #2   Title Pt will improve MMT of hip abductors by 1/2 MMT in order to demonstrate clinically significant improvement in strength.    Time 4    Period Weeks    Status Achieved    Target Date 05/18/20             PT Long Term Goals - 05/22/20 1658      PT LONG TERM GOAL #1   Title Pt will be able to squat, lift, and run without pain in order to demonstrate full functional return to household duties and physical fitness/exercise.    Time 8    Period Weeks    Status On-going    Target Date 07/03/20                 Plan - 05/22/20 1702    Clinical Impression Statement Pt with improving quad length, but has continued tightness and soreness in vastus lateralis and rec fem with palpation. Pt has shown decreased pain in  last few sessions after manual work to improve this. Continued tissue work and dry needing today to improve. Pt continues to have instability and tendency for knee valgus noted with SL activities, but has improved since start of care. She also has improved mechanics for squat, but is challenged with this due to mobility and strength. She is showing improvments in pain frequency and intensity, but continues to have bothersome pain that is limiting full functional activity and ability for exercise. Pt to benefit from continuation of skilled care to improve remaining deficits and to meet LTGs.    Personal Factors and Comorbidities Time since onset of injury/illness/exacerbation    Examination-Activity Limitations Stairs;Squat;Other;Bend;Caring for Others    Examination-Participation Restrictions Other;Community Activity    Stability/Clinical Decision Making Stable/Uncomplicated    Rehab Potential Good    PT Frequency 2x / week    PT Duration 6 weeks    PT Treatment/Interventions ADLs/Self Care Home Management;Electrical Stimulation;Moist Heat;Cryotherapy;Iontophoresis 4mg /ml Dexamethasone;Contrast Bath;Gait training;Stair training;Functional mobility training;Therapeutic activities;Therapeutic exercise;Balance training;Neuromuscular re-education;Patient/family education;Manual techniques;Passive range of motion;Dry needling;Joint Manipulations    PT Next Visit Plan SL wall sit, lateral lunge, SLS on foam with reach, runner's step up, SL RDL with weight    PT Home Exercise Plan [email or web address]    Consulted and Agree with Plan of Care Patient           Patient will benefit from skilled therapeutic intervention in order to improve the following deficits and impairments:  Decreased mobility,Increased muscle spasms,Impaired flexibility,Decreased range of motion,Pain,Decreased activity tolerance  Visit Diagnosis: Pain in joint of left knee  Pain in joint of right knee  Muscle weakness  (generalized)     Problem List Patient Active Problem List   Diagnosis Date Noted  . Breast lump 10/18/2018  . Pelvic and perineal pain 10/18/2018  . Lentigo 01/05/2018  . Melanocytic nevi of trunk 01/05/2018  . Hypotrichosis of eyelid 04/12/2016  . Mass of left axilla 04/12/2016  . Family history  of early CAD 03/21/2015    Lyndee Hensen, PT, DPT 5:07 PM  05/22/20    [facility] [place], [place], [address] Phone: [phone]   Fax:  [phone]  Name: SUNITA DEMOND MRN: [id number] Date of Birth: November 06, 1980

## 2020-05-23 DIAGNOSIS — D225 Melanocytic nevi of trunk: Secondary | ICD-10-CM | POA: Diagnosis not present

## 2020-05-23 DIAGNOSIS — Z411 Encounter for cosmetic surgery: Secondary | ICD-10-CM | POA: Diagnosis not present

## 2020-05-23 DIAGNOSIS — L814 Other melanin hyperpigmentation: Secondary | ICD-10-CM | POA: Diagnosis not present

## 2020-05-23 DIAGNOSIS — L578 Other skin changes due to chronic exposure to nonionizing radiation: Secondary | ICD-10-CM | POA: Diagnosis not present

## 2020-05-25 ENCOUNTER — Ambulatory Visit (INDEPENDENT_AMBULATORY_CARE_PROVIDER_SITE_OTHER): Payer: 59 | Admitting: Physical Therapy

## 2020-05-25 ENCOUNTER — Other Ambulatory Visit: Payer: Self-pay

## 2020-05-25 ENCOUNTER — Encounter: Payer: Self-pay | Admitting: Physical Therapy

## 2020-05-25 DIAGNOSIS — M25562 Pain in left knee: Secondary | ICD-10-CM | POA: Diagnosis not present

## 2020-05-25 DIAGNOSIS — M25561 Pain in right knee: Secondary | ICD-10-CM

## 2020-05-25 DIAGNOSIS — M6281 Muscle weakness (generalized): Secondary | ICD-10-CM | POA: Diagnosis not present

## 2020-05-25 NOTE — Patient Instructions (Signed)
Access Code: [facility] URL: [email or web address] Date: 05/25/2020 Prepared by: Daleen Bo  Exercises Prone Quadriceps Stretch with Strap - 2 x daily - 7 x weekly - 1 sets - 3 reps - 30 hold Trail Leg Lunge - 1 x daily - 3-4 x weekly - 3 sets - 10 reps Squat with Chair Touch and Resistance Loop - 1 x daily - 3-4 x weekly - 3 sets - 10 reps Quadriceps Mobilization with Foam Roll - 1 x daily - 7 x weekly - 1 sets - 1 reps - 38min hold Single Leg Bridge - 1 x daily - 3-4 x weekly - 3 sets - 10 reps Lateral Step Down - 1 x daily - 3-4 x weekly - 3 sets - 10 reps Sidestepping with band at knees and toe box** - 1 x daily - 3-4 x weekly - 1 sets - 2 reps - 32ft hold Sitting Knee Extension with Resistance - 1 x daily - 3-4 x weekly - 3 sets - 10 reps

## 2020-05-25 NOTE — Therapy (Signed)
[facility] [address] [place], [place], [address] Phone: [phone]   Fax:  [phone]  Physical Therapy Treatment  Patient Details  Name: Amanda Costa MRN: [id number] Date of Birth: 12-15-1980 Referring Provider (PT): Dr. Georgina Snell   Encounter Date: 05/25/2020   PT End of Session - 05/25/20 1038    Visit Number 9    Number of Visits 20    Date for PT Re-Evaluation 07/03/20    Authorization Type [place] Time Period Recert done at visit 8 : 05/22/20    PT Start Time 0935    PT Stop Time 1025    PT Time Calculation (min) 50 min    Activity Tolerance Patient tolerated treatment well    Behavior During Therapy [facility] for tasks assessed/performed           Past Medical History:  Diagnosis Date  . Cystic fibrosis carrier   . Family history of early CAD 03/21/2015   Mother and uncle. Pt had an ECHO many years ago.  . Left breast lump    1-2 cm knot L axilla during end of pregnancy  . SVD (spontaneous vaginal delivery)    x 2  . Tricuspid regurgitation     Past Surgical History:  Procedure Laterality Date  . LASIK Bilateral   . WISDOM TOOTH EXTRACTION      There were no vitals filed for this visit.   Subjective Assessment - 05/25/20 0937    Subjective Pt states she had a good response from DN. She felt the relief was longer this time around. She had some increased feeling of "swelling" and more pain within the joint itself. On occasion the heel tap at the stairs will cause pain. She feels that she is making progress but it is frustrating at what will recreate pain.    Currently in Pain? No/denies    Pain Score 0-No pain    Pain Onset More than a month ago    Aggravating Factors  increased activity, flexion, bending, squatting, running    Pain Relieving Factors ice, braces                             [facility] Adult PT Treatment/Exercise - 05/25/20 0001      Knee/Hip  Exercises: Stretches   Other Knee/Hip Stretches kneeling TC self mob- discussed set up for home      Knee/Hip Exercises: Standing   Functional Squat 20 reps    Functional Squat Limitations GTB at thighs, 5lbs each hand, no chair    Other Standing Knee Exercises Bulgarian split squat 10x    Other Standing Knee Exercises SL RDL 10 lbs each hand, runners lunge x 10 bil;  Side stepping- GTB around toe box, yellow around knees 77ft      Manual Therapy   Manual therapy comments --    Soft tissue mobilization STM bilat VL and rec fem                  PT Education - 05/25/20 1037    Education Details HEP update, POC, DN PRN, running/gait mechanics, footwear affects on running, exercise progression, strength focus   Person(s) Educated Patient    Methods Explanation;Demonstration;Tactile cues;Verbal cues    Comprehension Verbalized understanding;Returned demonstration            PT Short Term Goals - 05/22/20 1658      PT  SHORT TERM GOAL #1   Title Pt will become independent with HEP.    Time 2    Period Weeks    Status Achieved    Target Date 05/04/20      PT SHORT TERM GOAL #2   Title Pt will improve MMT of hip abductors by 1/2 MMT in order to demonstrate clinically significant improvement in strength.    Time 4    Period Weeks    Status Achieved    Target Date 05/18/20             PT Long Term Goals - 05/22/20 1658      PT LONG TERM GOAL #1   Title Pt will be able to squat, lift, and run without pain in order to demonstrate full functional return to household duties and physical fitness/exercise.    Time 8    Period Weeks    Status On-going    Target Date 07/03/20                 Plan - 05/25/20 1038    Clinical Impression Statement Pt had increased soft tissue extensibility of bilat vastus lateralis after STM. Pt reported feeling decreased stiffness and tightness following. Pt was able to progress SL strengthening exercise to include external  restance and elevated positions. Pt required cuing for frontal plane knee control as well as focus on hip and knee extension during exercise. Pt was able to perform exercise but does have moderate medial lateral deviations during SL squatting. Pt progressing toward independent strengthening to improve bilat quadriceps strength and control for full return to running. Decreased frequency of visits. Pt would benefit from continued skilled therapy in order to maximize bilateral knee and hip strength for full return to PLOF and physical fitness.    Personal Factors and Comorbidities Time since onset of injury/illness/exacerbation    Examination-Activity Limitations Stairs;Squat;Other;Bend;Caring for Others    Examination-Participation Restrictions Other;Community Activity    Stability/Clinical Decision Making Stable/Uncomplicated    Rehab Potential Good    PT Frequency 2x / week    PT Duration 6 weeks    PT Treatment/Interventions ADLs/Self Care Home Management;Electrical Stimulation;Moist Heat;Cryotherapy;Iontophoresis 4mg /ml Dexamethasone;Contrast Bath;Gait training;Stair training;Functional mobility training;Therapeutic activities;Therapeutic exercise;Balance training;Neuromuscular re-education;Patient/family education;Manual techniques;Passive range of motion;Dry needling;Joint Manipulations    PT Next Visit Plan SL wall sit, lateral lunge, SLS on foam with reach, runner's step up, SL RDL with weight    PT Home Exercise Plan [email or web address]    Consulted and Agree with Plan of Care Patient           Patient will benefit from skilled therapeutic intervention in order to improve the following deficits and impairments:  Decreased mobility,Increased muscle spasms,Impaired flexibility,Decreased range of motion,Pain,Decreased activity tolerance  Visit Diagnosis: Pain in joint of left knee  Pain in joint of right knee  Muscle weakness (generalized)     Problem List Patient Active Problem List    Diagnosis Date Noted  . Breast lump 10/18/2018  . Pelvic and perineal pain 10/18/2018  . Lentigo 01/05/2018  . Melanocytic nevi of trunk 01/05/2018  . Hypotrichosis of eyelid 04/12/2016  . Mass of left axilla 04/12/2016  . Family history of early CAD 03/21/2015   Daleen Bo PT, DPT 05/25/20 11:35 AM   [facility] [address] [place], [place], [address] Phone: [phone]   Fax:  [phone]  Name: NASHLA ALTHOFF MRN: [id number] Date of Birth: 1980-06-26

## 2020-05-29 ENCOUNTER — Other Ambulatory Visit (HOSPITAL_COMMUNITY): Payer: Self-pay | Admitting: Obstetrics and Gynecology

## 2020-05-29 MED FILL — NORETHIN ACE-ETH ESTRAD-FE: 1-20 | 84 days supply | Qty: 84 | Fill #0

## 2020-05-30 ENCOUNTER — Encounter: Payer: Self-pay | Admitting: Physical Therapy

## 2020-05-30 ENCOUNTER — Other Ambulatory Visit: Payer: Self-pay

## 2020-05-30 ENCOUNTER — Ambulatory Visit (INDEPENDENT_AMBULATORY_CARE_PROVIDER_SITE_OTHER): Payer: 59 | Admitting: Physical Therapy

## 2020-05-30 DIAGNOSIS — M25561 Pain in right knee: Secondary | ICD-10-CM

## 2020-05-30 DIAGNOSIS — M25562 Pain in left knee: Secondary | ICD-10-CM | POA: Diagnosis not present

## 2020-05-30 DIAGNOSIS — M6281 Muscle weakness (generalized): Secondary | ICD-10-CM

## 2020-05-30 NOTE — Therapy (Addendum)
[facility] [address] [place], [place], [address] Phone: [phone]   Fax:  [phone]  Physical Therapy Treatment  Patient Details  Name: Amanda Costa MRN: [id number] Date of Birth: 1980/06/16 Referring Provider (PT): Dr. Georgina Snell   Encounter Date: 05/30/2020   PT End of Session - 05/30/20 1358    Visit Number 10    Number of Visits 20    Date for PT Re-Evaluation 07/03/20    Authorization Type [place] Time Period Recert done at visit 8 : 05/22/20    PT Start Time 0935    PT Stop Time 1014    PT Time Calculation (min) 39 min    Activity Tolerance Patient tolerated treatment well    Behavior During Therapy [facility] for tasks assessed/performed           Past Medical History:  Diagnosis Date  . Cystic fibrosis carrier   . Family history of early CAD 03/21/2015   Mother and uncle. Pt had an ECHO many years ago.  . Left breast lump    1-2 cm knot L axilla during end of pregnancy  . SVD (spontaneous vaginal delivery)    x 2  . Tricuspid regurgitation     Past Surgical History:  Procedure Laterality Date  . LASIK Bilateral   . WISDOM TOOTH EXTRACTION      There were no vitals filed for this visit.   Subjective Assessment - 05/30/20 1212    Subjective Pt reports good pain improvements, pain only with going up/down stairs. May start PRP injections in next couple weeks.    Currently in Pain? No/denies    Pain Score 0-No pain                             [facility] Adult PT Treatment/Exercise - 05/30/20 0001      Knee/Hip Exercises: Stretches   Hip Flexor Stretch 3 reps;30 seconds    Hip Flexor Stretch Limitations kneeling on AirEx      Knee/Hip Exercises: Standing   Forward Step Up 10 reps;Step Height: 6";Both    Forward Step Up Limitations x5 bil on 10 in step    Other Standing Knee Exercises SL RDL 10 lbs each hand,2x10 bil;      Knee/Hip Exercises: Supine    Single Leg Bridge 20 reps;Both      Manual Therapy   Manual therapy comments skilled palpation and monitoring of soft tissue with dry needling.    Soft tissue mobilization STm and IASTM to VL and rec fem            Trigger Point Dry Needling - 05/30/20 0001    Consent Given? Yes    Education Handout Provided Previously provided    Rectus femoris Response Twitch response elicited;Palpable increased muscle length   bil   Vastus lateralis Response Twitch response elicited;Palpable increased muscle length   bil                 PT Short Term Goals - 05/22/20 1658      PT SHORT TERM GOAL #1   Title Pt will become independent with HEP.    Time 2    Period Weeks    Status Achieved    Target Date 05/04/20      PT SHORT TERM GOAL #2   Title Pt will improve MMT of hip abductors by 1/2 MMT in order  to demonstrate clinically significant improvement in strength.    Time 4    Period Weeks    Status Achieved    Target Date 05/18/20             PT Long Term Goals - 05/22/20 1658      PT LONG TERM GOAL #1   Title Pt will be able to squat, lift, and run without pain in order to demonstrate full functional return to household duties and physical fitness/exercise.    Time 8    Period Weeks    Status On-going    Target Date 07/03/20                 Plan - 05/30/20 1359    Clinical Impression Statement Pt has been responding well to manual therapy for reducing tightness and trigger points in central and lateral quad. Good results and improved tension and pain with dry needling and IASTM today. Pt with less pain overall with activities. She continues to have noted instability with Single leg activity, and will benefit from continued strength and stability.    Personal Factors and Comorbidities Time since onset of injury/illness/exacerbation    Examination-Activity Limitations Stairs;Squat;Other;Bend;Caring for Others    Examination-Participation Restrictions  Other;Community Activity    Stability/Clinical Decision Making Stable/Uncomplicated    Rehab Potential Good    PT Frequency 2x / week    PT Duration 6 weeks    PT Treatment/Interventions ADLs/Self Care Home Management;Electrical Stimulation;Moist Heat;Cryotherapy;Iontophoresis 44m/ml Dexamethasone;Contrast Bath;Gait training;Stair training;Functional mobility training;Therapeutic activities;Therapeutic exercise;Balance training;Neuromuscular re-education;Patient/family education;Manual techniques;Passive range of motion;Dry needling;Joint Manipulations    PT Next Visit Plan SL wall sit, lateral lunge, SLS on foam with reach, runner's step up, SL RDL with weight    PT Home Exercise Plan [email or web address]    Consulted and Agree with Plan of Care Patient           Patient will benefit from skilled therapeutic intervention in order to improve the following deficits and impairments:  Decreased mobility,Increased muscle spasms,Impaired flexibility,Decreased range of motion,Pain,Decreased activity tolerance  Visit Diagnosis: Pain in joint of left knee  Pain in joint of right knee  Muscle weakness (generalized)     Problem List Patient Active Problem List   Diagnosis Date Noted  . Breast lump 10/18/2018  . Pelvic and perineal pain 10/18/2018  . Lentigo 01/05/2018  . Melanocytic nevi of trunk 01/05/2018  . Hypotrichosis of eyelid 04/12/2016  . Mass of left axilla 04/12/2016  . Family history of early CAD 03/21/2015    LLyndee Hensen PT, DPT 2:05 PM  05/30/20    [facility] [address]Phone: [phone]  Fax:  [phone] Name: Amanda MUCKEYMRN: [id number] of Birth: [birth year]  PHYSICAL THERAPY DISCHARGE SUMMARY  Visits from Start of Care: 10 Plan: Patient agrees to discharge.  Patient goals were met. Patient is being discharged due to being pleased with the current functional level.  ?????      LLyndee Hensen PT, DPT 1:41 PM  07/11/20

## 2020-06-06 ENCOUNTER — Encounter: Payer: 59 | Admitting: Physical Therapy

## 2020-06-12 NOTE — Progress Notes (Addendum)
Subjective:    Patient ID: Amanda Costa, female    DOB: 1980-04-04, 40 y.o.   MRN: [id number]   This visit occurred during the SARS-CoV-2 public health emergency.  Safety protocols were in place, including screening questions prior to the visit, additional usage of staff PPE, and extensive cleaning of exam room while observing appropriate contact time as indicated for disinfecting solutions.    HPI  She is here to establish with a new pcp.  She is here for a physical exam.   She has no specific concerns.  Medications and allergies reviewed with patient and updated if appropriate.  Patient Active Problem List   Diagnosis Date Noted  . Breast lump 10/18/2018  . Pelvic and perineal pain 10/18/2018  . Lentigo 01/05/2018  . Melanocytic nevi of trunk 01/05/2018  . Hypotrichosis of eyelid 04/12/2016  . Mass of left axilla 04/12/2016  . Family history of early CAD 03/21/2015    Current Outpatient Medications on File Prior to Visit  Medication Sig Dispense Refill  . Multiple Vitamin (MULTI-VITAMIN DAILY PO) Multi Vitamin    . Norethin Ace-Eth Estrad-FE 1-20 MG-MCG(24) CHEW CHEW 1 TABLET BY MOUTH ONCE A DAY. MUST MAKE APPT FOR REFILLS 84 tablet 0  . Omega-3 Fatty Acids (FISH OIL) 1200 MG CAPS Take 1,200 mg by mouth daily.     No current facility-administered medications on file prior to visit.    Past Medical History:  Diagnosis Date  . Cystic fibrosis carrier   . Family history of early CAD 03/21/2015   Mother and uncle. Pt had an ECHO many years ago.  . Left breast lump    1-2 cm knot L axilla during end of pregnancy  . SVD (spontaneous vaginal delivery)    x 2  . Tricuspid regurgitation     Past Surgical History:  Procedure Laterality Date  . LASIK Bilateral   . WISDOM TOOTH EXTRACTION      Social History   Socioeconomic History  . Marital status: Married    Spouse name: Not on file  . Number of children: Not on file  . Years of education: Not on file  .  Highest education level: Not on file  Occupational History  . Occupation: Art therapist: [employer]  Tobacco Use  . Smoking status: Never Smoker  . Smokeless tobacco: Never Used  Substance and Sexual Activity  . Alcohol use: No  . Drug use: No  . Sexual activity: Yes    Birth control/protection: None    Comment: svd 04/27/15 for retained products  Other Topics Concern  . Not on file  Social History Narrative  . Not on file   Social Determinants of Health   Financial Resource Strain: Not on file  Food Insecurity: Not on file  Transportation Needs: Not on file  Physical Activity: Not on file  Stress: Not on file  Social Connections: Not on file    Family History  Problem Relation Age of Onset  . Arthritis Mother   . Asthma Mother   . Hypertension Mother   . Hypothyroidism Mother   . Heart disease Mother   . Diabetes Father   . Heart disease Maternal Uncle   . Hearing loss Maternal Uncle   . Alcohol abuse Paternal Aunt   . Alcohol abuse Paternal Uncle   . Arthritis Maternal Grandmother   . Alcohol abuse Paternal Grandmother   . Diabetes Paternal Grandmother   . Skin cancer Paternal Grandmother   .  Alcohol abuse Paternal Grandfather     Review of Systems  Constitutional: Negative for chills and fever.  Eyes: Negative for visual disturbance.  Respiratory: Negative for cough and wheezing.   Cardiovascular: Positive for leg swelling (very mild). Negative for chest pain and palpitations.  Gastrointestinal: Negative for abdominal pain, blood in stool, constipation, diarrhea and nausea.  Genitourinary: Positive for frequency. Negative for dysuria and hematuria.  Musculoskeletal: Positive for arthralgias. Negative for back pain.  Skin: Negative for color change and rash.  Neurological: Negative for light-headedness and headaches.  Psychiatric/Behavioral: Negative for dysphoric mood. The patient is not nervous/anxious.        Objective:   Vitals:   06/13/20 1005   BP: 108/78  Pulse: 81  Temp: 98 F (36.7 C)  SpO2: 99%   Filed Weights   06/13/20 1005  Weight: 123 lb (55.8 kg)   Body mass index is 20.47 kg/m.  BP Readings from Last 3 Encounters:  06/13/20 108/78  03/23/20 102/72  05/09/17 110/76    Wt Readings from Last 3 Encounters:  06/13/20 123 lb (55.8 kg)  03/23/20 124 lb 6.4 oz (56.4 kg)  05/09/17 125 lb 12.8 oz (57.1 kg)     Physical Exam Constitutional: She appears well-developed and well-nourished. No distress.  HENT:  Head: Normocephalic and atraumatic.  Right Ear: External ear normal. Normal ear canal and TM Left Ear: External ear normal.  Normal ear canal and TM Mouth/Throat: Oropharynx is clear and moist.  Eyes: Conjunctivae and EOM are normal.  Neck: Neck supple. No tracheal deviation present. No thyromegaly present.  No carotid bruit  Cardiovascular: Normal rate, regular rhythm and normal heart sounds.   No murmur heard.  No edema. Pulmonary/Chest: Effort normal and breath sounds normal. No respiratory distress. She has no wheezes. She has no rales.  Breast: deferred   Abdominal: Soft. She exhibits no distension. There is no tenderness.  Lymphadenopathy: She has no cervical adenopathy.  Skin: Skin is warm and dry. She is not diaphoretic.  Psychiatric: She has a normal mood and affect. Her behavior is normal.        Assessment & Plan:   Physical exam: Screening blood work    Ordered - lipids, covd IgG Immunizations   up-to-date Gyn    up-to-date Exercise    regular Weight    normal Substance abuse        See Problem List for Assessment and Plan of chronic medical problems.

## 2020-06-12 NOTE — Patient Instructions (Addendum)
Blood work was ordered.     Medications changes include :   none    Please followup in 1-2 years    Health Maintenance, Female Adopting a healthy lifestyle and getting preventive care are important in promoting health and wellness. Ask your health care provider about:  The right schedule for you to have regular tests and exams.  Things you can do on your own to prevent diseases and keep yourself healthy. What should I know about diet, weight, and exercise? Eat a healthy diet  Eat a diet that includes plenty of vegetables, fruits, low-fat dairy products, and lean protein.  Do not eat a lot of foods that are high in solid fats, added sugars, or sodium.   Maintain a healthy weight Body mass index (BMI) is used to identify weight problems. It estimates body fat based on height and weight. Your health care provider can help determine your BMI and help you achieve or maintain a healthy weight. Get regular exercise Get regular exercise. This is one of the most important things you can do for your health. Most adults should:  Exercise for at least 150 minutes each week. The exercise should increase your heart rate and make you sweat (moderate-intensity exercise).  Do strengthening exercises at least twice a week. This is in addition to the moderate-intensity exercise.  Spend less time sitting. Even light physical activity can be beneficial. Watch cholesterol and blood lipids Have your blood tested for lipids and cholesterol at 39 years of age, then have this test every 5 years. Have your cholesterol levels checked more often if:  Your lipid or cholesterol levels are high.  You are older than 39 years of age.  You are at high risk for heart disease. What should I know about cancer screening? Depending on your health history and family history, you may need to have cancer screening at various ages. This may include screening for:  Breast cancer.  Cervical cancer.  Colorectal  cancer.  Skin cancer.  Lung cancer. What should I know about heart disease, diabetes, and high blood pressure? Blood pressure and heart disease  High blood pressure causes heart disease and increases the risk of stroke. This is more likely to develop in people who have high blood pressure readings, are of African descent, or are overweight.  Have your blood pressure checked: ? Every 3-5 years if you are 42-12 years of age. ? Every year if you are 3 years old or older. Diabetes Have regular diabetes screenings. This checks your fasting blood sugar level. Have the screening done:  Once every three years after age 36 if you are at a normal weight and have a low risk for diabetes.  More often and at a younger age if you are overweight or have a high risk for diabetes. What should I know about preventing infection? Hepatitis B If you have a higher risk for hepatitis B, you should be screened for this virus. Talk with your health care provider to find out if you are at risk for hepatitis B infection. Hepatitis C Testing is recommended for:  Everyone born from [birth year] through 1964.  Anyone with known risk factors for hepatitis C. Sexually transmitted infections (STIs)  Get screened for STIs, including gonorrhea and chlamydia, if: ? You are sexually active and are younger than 39 years of age. ? You are older than 39 years of age and your health care provider tells you that you are at risk for this type of infection. ?  Your sexual activity has changed since you were last screened, and you are at increased risk for chlamydia or gonorrhea. Ask your health care provider if you are at risk.  Ask your health care provider about whether you are at high risk for HIV. Your health care provider may recommend a prescription medicine to help prevent HIV infection. If you choose to take medicine to prevent HIV, you should first get tested for HIV. You should then be tested every 3 months for as long as  you are taking the medicine. Pregnancy  If you are about to stop having your period (premenopausal) and you may become pregnant, seek counseling before you get pregnant.  Take 400 to 800 micrograms (mcg) of folic acid every day if you become pregnant.  Ask for birth control (contraception) if you want to prevent pregnancy. Osteoporosis and menopause Osteoporosis is a disease in which the bones lose minerals and strength with aging. This can result in bone fractures. If you are 72 years old or older, or if you are at risk for osteoporosis and fractures, ask your health care provider if you should:  Be screened for bone loss.  Take a calcium or vitamin D supplement to lower your risk of fractures.  Be given hormone replacement therapy (HRT) to treat symptoms of menopause. Follow these instructions at home: Lifestyle  Do not use any products that contain nicotine or tobacco, such as cigarettes, e-cigarettes, and chewing tobacco. If you need help quitting, ask your health care provider.  Do not use street drugs.  Do not share needles.  Ask your health care provider for help if you need support or information about quitting drugs. Alcohol use  Do not drink alcohol if: ? Your health care provider tells you not to drink. ? You are pregnant, may be pregnant, or are planning to become pregnant.  If you drink alcohol: ? Limit how much you use to 0-1 drink a day. ? Limit intake if you are breastfeeding.  Be aware of how much alcohol is in your drink. In the [place] one drink equals one 12 oz bottle of beer (355 mL), one 5 oz glass of wine (148 mL), or one 1 oz glass of hard liquor (44 mL). General instructions  Schedule regular health, dental, and eye exams.  Stay current with your vaccines.  Tell your health care provider if: ? You often feel depressed. ? You have ever been abused or do not feel safe at home. Summary  Adopting a healthy lifestyle and getting preventive care are  important in promoting health and wellness.  Follow your health care provider's instructions about healthy diet, exercising, and getting tested or screened for diseases.  Follow your health care provider's instructions on monitoring your cholesterol and blood pressure. This information is not intended to replace advice given to you by your health care provider. Make sure you discuss any questions you have with your health care provider. Document Revised: 02/11/2018 Document Reviewed: 02/11/2018 Elsevier Patient Education  2021 [employer].

## 2020-06-13 ENCOUNTER — Ambulatory Visit (INDEPENDENT_AMBULATORY_CARE_PROVIDER_SITE_OTHER): Payer: 59 | Admitting: Family Medicine

## 2020-06-13 ENCOUNTER — Encounter: Payer: Self-pay | Admitting: Internal Medicine

## 2020-06-13 ENCOUNTER — Ambulatory Visit (INDEPENDENT_AMBULATORY_CARE_PROVIDER_SITE_OTHER): Payer: 59 | Admitting: Internal Medicine

## 2020-06-13 ENCOUNTER — Other Ambulatory Visit: Payer: Self-pay

## 2020-06-13 ENCOUNTER — Ambulatory Visit: Payer: Self-pay

## 2020-06-13 VITALS — BP 108/78 | HR 81 | Temp 98.0°F | Ht 65.0 in | Wt 123.0 lb

## 2020-06-13 DIAGNOSIS — Z1159 Encounter for screening for other viral diseases: Secondary | ICD-10-CM

## 2020-06-13 DIAGNOSIS — G8929 Other chronic pain: Secondary | ICD-10-CM

## 2020-06-13 DIAGNOSIS — Z0000 Encounter for general adult medical examination without abnormal findings: Secondary | ICD-10-CM

## 2020-06-13 DIAGNOSIS — R2232 Localized swelling, mass and lump, left upper limb: Secondary | ICD-10-CM

## 2020-06-13 DIAGNOSIS — M25562 Pain in left knee: Secondary | ICD-10-CM

## 2020-06-13 LAB — LIPID PANEL
Cholesterol: 174 mg/dL (ref 0–200)
HDL: 63.6 mg/dL
LDL Cholesterol: 90 mg/dL (ref 0–99)
NonHDL: 109.97
Total CHOL/HDL Ratio: 3
Triglycerides: 98 mg/dL (ref 0.0–149.0)
VLDL: 19.6 mg/dL (ref 0.0–40.0)

## 2020-06-13 NOTE — Patient Instructions (Addendum)
Thank you for coming in today.  Call or go to the ER if you develop a large red swollen joint with extreme pain or oozing puss.   Avoid NSAIDS

## 2020-06-13 NOTE — Progress Notes (Signed)
Amanda Costa presents to clinic today for PRP injection left knee  Procedure: Real-time Ultrasound Guided Injection of superior lateral patellar space Device: Philips Affiniti 50G Images permanently stored and available for review in PACS Verbal informed consent obtained.  Discussed risks and benefits of procedure. Warned about infection bleeding damage to structures skin hypopigmentation and fat atrophy among others. Patient expresses understanding and agreement Time-out conducted.   Noted no overlying erythema, induration, or other signs of local infection.   Skin prepped in a sterile fashion.   Local anesthesia: Topical Ethyl chloride.   With sterile technique and under real time ultrasound guidance:  6 mL leukocyte depleted PRP injected into knee joint. Fluid seen entering the joint capsule.   Completed without difficulty   Advised to call if fevers/chills, erythema, induration, drainage, or persistent bleeding.   Images permanently stored and available for review in the ultrasound unit.  Impression: Technically successful ultrasound guided injection.   Post PRP injection protocol reviewed.  Check back as needed.

## 2020-06-14 LAB — SARS COV-2 SEROLOGY(COVID-19)AB(IGG,IGM),IMMUNOASSAY
SARS CoV-2 AB IgG: NEGATIVE
SARS CoV-2 IgM: NEGATIVE

## 2020-07-11 ENCOUNTER — Other Ambulatory Visit (HOSPITAL_COMMUNITY): Payer: Self-pay

## 2020-07-11 ENCOUNTER — Other Ambulatory Visit (HOSPITAL_BASED_OUTPATIENT_CLINIC_OR_DEPARTMENT_OTHER): Payer: Self-pay

## 2020-07-11 ENCOUNTER — Other Ambulatory Visit: Payer: Self-pay | Admitting: Family Medicine

## 2020-07-11 MED ORDER — MELOXICAM 7.5 MG PO TABS
ORAL_TABLET | Freq: Every day | ORAL | 1 refills | Status: AC
  Filled 2020-07-11: qty 60, 30d supply, fill #0
  Filled 2020-09-14: qty 60, 30d supply, fill #1

## 2020-08-15 ENCOUNTER — Other Ambulatory Visit (HOSPITAL_COMMUNITY): Payer: Self-pay

## 2020-08-15 MED FILL — Norethindrone Ace-Eth Estradiol-FE Chew Tab 1 MG-20 MCG (24): ORAL | 84 days supply | Qty: 84 | Fill #0 | Status: AC

## 2020-08-29 ENCOUNTER — Other Ambulatory Visit (HOSPITAL_COMMUNITY): Payer: Self-pay

## 2020-08-29 ENCOUNTER — Encounter: Payer: Self-pay | Admitting: Internal Medicine

## 2020-08-29 ENCOUNTER — Ambulatory Visit (INDEPENDENT_AMBULATORY_CARE_PROVIDER_SITE_OTHER): Payer: 59 | Admitting: Internal Medicine

## 2020-08-29 ENCOUNTER — Other Ambulatory Visit: Payer: Self-pay

## 2020-08-29 DIAGNOSIS — F3281 Premenstrual dysphoric disorder: Secondary | ICD-10-CM | POA: Diagnosis not present

## 2020-08-29 DIAGNOSIS — F32A Depression, unspecified: Secondary | ICD-10-CM | POA: Insufficient documentation

## 2020-08-29 MED ORDER — BUPROPION HCL ER (XL) 150 MG PO TB24
150.0000 mg | ORAL_TABLET | Freq: Every day | ORAL | 5 refills | Status: DC
  Filled 2020-08-29: qty 30, 30d supply, fill #0
  Filled 2020-09-23: qty 30, 30d supply, fill #1
  Filled 2020-10-18: qty 30, 30d supply, fill #2
  Filled 2020-11-21: qty 30, 30d supply, fill #3
  Filled 2020-12-21: qty 30, 30d supply, fill #4
  Filled 2021-01-21: qty 30, 30d supply, fill #5

## 2020-08-29 NOTE — Progress Notes (Signed)
Subjective:    Patient ID: Amanda Costa, female    DOB: 03/17/1980, 40 y.o.   MRN: [id number]  HPI The patient is here for an acute visit.  She has been experiencing a low-level mood and and some fatigue that she recently realized was likely mild depression.  She is always had a little bit of PMDD and is on birth control for that and that has helped for the most part until recently.  For the past couple of years she has felt more fatigue and struggled a little bit more, but again did not realize until recently that this most likely was some low-grade depression.  She does have some decreased concentration, fatigue and feels slightly depressed.  She does not feel that she is struggling with any significant anxiety.  She has some anxiety at times, but this is very manageable.  She is wondering at this point if she does need medication to help her.     Medications and allergies reviewed with patient and updated if appropriate.  Patient Active Problem List   Diagnosis Date Noted   Breast lump 10/18/2018   Pelvic and perineal pain 10/18/2018   Lentigo 01/05/2018   Melanocytic nevi of trunk 01/05/2018   Hypotrichosis of eyelid 04/12/2016   Mass of left axilla 04/12/2016   Family history of early CAD 03/21/2015    Current Outpatient Medications on File Prior to Visit  Medication Sig Dispense Refill   Cholecalciferol (VITAMIN D3) 50 MCG (2000 UT) TABS Take 50 mcg by mouth daily.     Krill Oil 500 MG CAPS Take 500 mg by mouth daily.     meloxicam (MOBIC) 7.5 MG tablet TAKE 1 TO 2 TABLETS BY MOUTH DAILY 60 tablet 1   Multiple Vitamin (MULTI-VITAMIN DAILY PO) Multi Vitamin     Norethin Ace-Eth Estrad-FE 1-20 MG-MCG(24) CHEW CHEW 1 TABLET BY MOUTH ONCE A DAY. MUST MAKE APPT FOR REFILLS 84 tablet 0   Turmeric 500 MG CAPS Take 500 mg by mouth 2 (two) times daily.     Ascorbic Acid (VITAMIN C) 1000 MG tablet Take 1,000 mg by mouth daily.     Collagen-Boron-Hyaluronic Acid (MOVE FREE ULTRA  JOINT HEALTH) 40-5-3.3 MG TABS Take by mouth daily.     meloxicam (MOBIC) 7.5 MG tablet Take 7.5 mg by mouth as needed for pain. Takes 1-2 prn     Omega-3 Fatty Acids (FISH OIL) 1200 MG CAPS Take 1,200 mg by mouth daily.     No current facility-administered medications on file prior to visit.    Past Medical History:  Diagnosis Date   Cystic fibrosis carrier    Family history of early CAD 03/21/2015   Mother and uncle. Pt had an ECHO many years ago.   Left breast lump    1-2 cm knot L axilla during end of pregnancy   SVD (spontaneous vaginal delivery)    x 2   Tricuspid regurgitation     Past Surgical History:  Procedure Laterality Date   LASIK Bilateral    WISDOM TOOTH EXTRACTION      Social History   Socioeconomic History   Marital status: Married    Spouse name: Not on file   Number of children: Not on file   Years of education: Not on file   Highest education level: Not on file  Occupational History   Occupation: CRNA    Employer: [employer]  Tobacco Use   Smoking status: Never   Smokeless tobacco: Never  Substance and Sexual Activity   Alcohol use: No   Drug use: No   Sexual activity: Yes    Birth control/protection: None    Comment: svd 04/27/15 for retained products  Other Topics Concern   Not on file  Social History Narrative   Not on file   Social Determinants of Health   Financial Resource Strain: Not on file  Food Insecurity: Not on file  Transportation Needs: Not on file  Physical Activity: Not on file  Stress: Not on file  Social Connections: Not on file    Family History  Problem Relation Age of Onset   Arthritis Mother    Asthma Mother    Hypertension Mother    Hypothyroidism Mother    Heart disease Mother    Diabetes Father    Heart disease Maternal Uncle    Hearing loss Maternal Uncle    Alcohol abuse Paternal Aunt    Alcohol abuse Paternal Uncle    Arthritis Maternal Grandmother    Alcohol abuse Paternal Grandmother     Diabetes Paternal Grandmother    Skin cancer Paternal Grandmother    Alcohol abuse Paternal Grandfather     Review of Systems  Constitutional:  Positive for fatigue. Negative for appetite change.  Psychiatric/Behavioral:  Positive for decreased concentration and dysphoric mood. Negative for sleep disturbance. The patient is not nervous/anxious.       Objective:   Vitals:   08/29/20 1552  BP: 118/70  Pulse: 65  SpO2: 97%   BP Readings from Last 3 Encounters:  08/29/20 118/70  06/13/20 108/78  03/23/20 102/72   Wt Readings from Last 3 Encounters:  08/29/20 121 lb (54.9 kg)  06/13/20 123 lb (55.8 kg)  03/23/20 124 lb 6.4 oz (56.4 kg)   Body mass index is 20.14 kg/m.   Physical Exam Constitutional:      General: She is not in acute distress.    Appearance: Normal appearance. She is not ill-appearing.  HENT:     Head: Normocephalic and atraumatic.  Skin:    General: Skin is warm and dry.  Neurological:     Mental Status: She is alert and oriented to person, place, and time.  Psychiatric:        Behavior: Behavior normal.        Thought Content: Thought content normal.        Judgment: Judgment normal.     Comments: Mild depressed mood and affect           Assessment & Plan:    See Problem List for Assessment and Plan of chronic medical problems.    This visit occurred during the SARS-CoV-2 public health emergency.  Safety protocols were in place, including screening questions prior to the visit, additional usage of staff PPE, and extensive cleaning of exam room while observing appropriate contact time as indicated for disinfecting solutions.

## 2020-08-29 NOTE — Patient Instructions (Signed)
   Start wellbutrin 150 mg daily - take it in the morning.      Follow up in 3 weeks, sooner if needed

## 2020-08-29 NOTE — Assessment & Plan Note (Signed)
New Related to PMDD Currently on birth control and will discuss if any changes in her birth control is needed with her gynecologist next month at her annual appointment Discussed options Will try bupropion XL 150 mg daily in the morning-this may help increase energy level, concentration and mood I am not sure if this will be enough or we will need to possibly add a low-dose of Celexa-she will follow-up in 3 weeks to reevaluate, sooner if needed This point deferred therapy, but can consider that in the future.  I think most of her depression is related to her PMDD and I think the medication will help

## 2020-09-05 ENCOUNTER — Ambulatory Visit: Payer: 59 | Admitting: Internal Medicine

## 2020-09-14 ENCOUNTER — Other Ambulatory Visit (HOSPITAL_COMMUNITY): Payer: Self-pay

## 2020-09-23 ENCOUNTER — Other Ambulatory Visit (HOSPITAL_COMMUNITY): Payer: Self-pay

## 2020-09-26 ENCOUNTER — Ambulatory Visit: Payer: 59 | Admitting: Internal Medicine

## 2020-09-26 ENCOUNTER — Other Ambulatory Visit (HOSPITAL_COMMUNITY): Payer: Self-pay

## 2020-09-26 DIAGNOSIS — Z6820 Body mass index (BMI) 20.0-20.9, adult: Secondary | ICD-10-CM | POA: Diagnosis not present

## 2020-09-26 DIAGNOSIS — Z3041 Encounter for surveillance of contraceptive pills: Secondary | ICD-10-CM | POA: Diagnosis not present

## 2020-09-26 DIAGNOSIS — Z1151 Encounter for screening for human papillomavirus (HPV): Secondary | ICD-10-CM | POA: Diagnosis not present

## 2020-09-26 DIAGNOSIS — Z130 Encounter for screening for diseases of the blood and blood-forming organs and certain disorders involving the immune mechanism: Secondary | ICD-10-CM | POA: Diagnosis not present

## 2020-09-26 DIAGNOSIS — Z01419 Encounter for gynecological examination (general) (routine) without abnormal findings: Secondary | ICD-10-CM | POA: Diagnosis not present

## 2020-09-26 DIAGNOSIS — Z124 Encounter for screening for malignant neoplasm of cervix: Secondary | ICD-10-CM | POA: Diagnosis not present

## 2020-09-26 MED ORDER — NORETHIN ACE-ETH ESTRAD-FE 1-20 MG-MCG(24) PO CHEW
CHEWABLE_TABLET | ORAL | 4 refills | Status: DC
  Filled 2020-09-26 – 2020-11-03 (×2): qty 84, 84d supply, fill #0
  Filled 2021-02-13: qty 84, 84d supply, fill #1
  Filled 2021-04-14: qty 84, 84d supply, fill #2
  Filled 2021-07-15: qty 84, 84d supply, fill #3

## 2020-10-01 ENCOUNTER — Encounter: Payer: Self-pay | Admitting: Internal Medicine

## 2020-10-01 NOTE — Progress Notes (Deleted)
Subjective:    Patient ID: Amanda Costa, female    DOB: 1980/06/15, 40 y.o.   MRN: [id number]  HPI The patient is here for follow up of their chronic medical problems, including PMDD, Dep.    Medications and allergies reviewed with patient and updated if appropriate.  Patient Active Problem List   Diagnosis Date Noted   Depression 08/29/2020   PMDD (premenstrual dysphoric disorder) 08/29/2020   Breast lump 10/18/2018   Lentigo 01/05/2018   Melanocytic nevi of trunk 01/05/2018   Mass of left axilla 04/12/2016   Family history of early CAD 03/21/2015    Current Outpatient Medications on File Prior to Visit  Medication Sig Dispense Refill   buPROPion (WELLBUTRIN XL) 150 MG 24 hr tablet Take 1 tablet (150 mg total) by mouth daily. 30 tablet 5   Cholecalciferol (VITAMIN D3) 50 MCG (2000 UT) TABS Take 50 mcg by mouth daily.     Krill Oil 500 MG CAPS Take 500 mg by mouth daily.     meloxicam (MOBIC) 7.5 MG tablet TAKE 1 TO 2 TABLETS BY MOUTH DAILY 60 tablet 1   Multiple Vitamin (MULTI-VITAMIN DAILY PO) Multi Vitamin     Norethin Ace-Eth Estrad-FE 1-20 MG-MCG(24) CHEW CHEW 1 TABLET BY MOUTH ONCE A DAY 84 tablet 4   Turmeric 500 MG CAPS Take 500 mg by mouth 2 (two) times daily.     No current facility-administered medications on file prior to visit.    Past Medical History:  Diagnosis Date   Cystic fibrosis carrier    Family history of early CAD 03/21/2015   Mother and uncle. Pt had an ECHO many years ago.   Left breast lump    1-2 cm knot L axilla during end of pregnancy   SVD (spontaneous vaginal delivery)    x 2   Tricuspid regurgitation     Past Surgical History:  Procedure Laterality Date   LASIK Bilateral    WISDOM TOOTH EXTRACTION      Social History   Socioeconomic History   Marital status: Married    Spouse name: Not on file   Number of children: Not on file   Years of education: Not on file   Highest education level: Not on file  Occupational  History   Occupation: CRNA    Employer:   Tobacco Use   Smoking status: Never   Smokeless tobacco: Never  Substance and Sexual Activity   Alcohol use: No   Drug use: No   Sexual activity: Yes    Birth control/protection: None    Comment: svd 04/27/15 for retained products  Other Topics Concern   Not on file  Social History Narrative   Not on file   Social Determinants of Health   Financial Resource Strain: Not on file  Food Insecurity: Not on file  Transportation Needs: Not on file  Physical Activity: Not on file  Stress: Not on file  Social Connections: Not on file    Family History  Problem Relation Age of Onset   Arthritis Mother    Asthma Mother    Hypertension Mother    Hypothyroidism Mother    Heart disease Mother    Diabetes Father    Heart disease Maternal Uncle    Hearing loss Maternal Uncle    Alcohol abuse Paternal Aunt    Alcohol abuse Paternal Uncle    Arthritis Maternal Grandmother    Alcohol abuse Paternal Grandmother    Diabetes Paternal Grandmother  Skin cancer Paternal Grandmother    Alcohol abuse Paternal Grandfather     Review of Systems     Objective:  There were no vitals filed for this visit. BP Readings from Last 3 Encounters:  08/29/20 118/70  06/13/20 108/78  03/23/20 102/72   Wt Readings from Last 3 Encounters:  08/29/20 121 lb (54.9 kg)  06/13/20 123 lb (55.8 kg)  03/23/20 124 lb 6.4 oz (56.4 kg)   There is no height or weight on file to calculate BMI.   Physical Exam          Assessment & Plan:    See Problem List for Assessment and Plan of chronic medical problems.    This visit occurred during the SARS-CoV-2 public health emergency.  Safety protocols were in place, including screening questions prior to the visit, additional usage of staff PPE, and extensive cleaning of exam room while observing appropriate contact time as indicated for disinfecting solutions.

## 2020-10-02 ENCOUNTER — Ambulatory Visit: Payer: 59 | Admitting: Internal Medicine

## 2020-10-02 DIAGNOSIS — F3281 Premenstrual dysphoric disorder: Secondary | ICD-10-CM

## 2020-10-18 ENCOUNTER — Other Ambulatory Visit (HOSPITAL_COMMUNITY): Payer: Self-pay

## 2020-11-03 ENCOUNTER — Other Ambulatory Visit (HOSPITAL_COMMUNITY): Payer: Self-pay

## 2020-11-21 ENCOUNTER — Other Ambulatory Visit (HOSPITAL_COMMUNITY): Payer: Self-pay

## 2020-12-21 ENCOUNTER — Other Ambulatory Visit (HOSPITAL_COMMUNITY): Payer: Self-pay

## 2021-01-12 DIAGNOSIS — H5213 Myopia, bilateral: Secondary | ICD-10-CM | POA: Diagnosis not present

## 2021-01-22 ENCOUNTER — Other Ambulatory Visit (HOSPITAL_COMMUNITY): Payer: Self-pay

## 2021-02-13 ENCOUNTER — Other Ambulatory Visit (HOSPITAL_COMMUNITY): Payer: Self-pay

## 2021-02-13 ENCOUNTER — Other Ambulatory Visit: Payer: Self-pay | Admitting: Internal Medicine

## 2021-02-13 MED ORDER — BUPROPION HCL ER (XL) 150 MG PO TB24
150.0000 mg | ORAL_TABLET | Freq: Every day | ORAL | 5 refills | Status: DC
  Filled 2021-02-13: qty 30, 30d supply, fill #0
  Filled 2021-03-21: qty 30, 30d supply, fill #1
  Filled 2021-04-14: qty 30, 30d supply, fill #2
  Filled 2021-05-19: qty 30, 30d supply, fill #3
  Filled 2021-06-18: qty 30, 30d supply, fill #4
  Filled 2021-07-15: qty 30, 30d supply, fill #5

## 2021-02-15 ENCOUNTER — Other Ambulatory Visit (HOSPITAL_COMMUNITY): Payer: Self-pay

## 2021-03-21 ENCOUNTER — Other Ambulatory Visit (HOSPITAL_COMMUNITY): Payer: Self-pay

## 2021-04-14 ENCOUNTER — Other Ambulatory Visit (HOSPITAL_COMMUNITY): Payer: Self-pay

## 2021-04-21 ENCOUNTER — Other Ambulatory Visit (HOSPITAL_COMMUNITY): Payer: Self-pay

## 2021-05-19 ENCOUNTER — Other Ambulatory Visit (HOSPITAL_COMMUNITY): Payer: Self-pay

## 2021-05-23 DIAGNOSIS — L814 Other melanin hyperpigmentation: Secondary | ICD-10-CM | POA: Diagnosis not present

## 2021-05-23 DIAGNOSIS — Z23 Encounter for immunization: Secondary | ICD-10-CM | POA: Diagnosis not present

## 2021-05-23 DIAGNOSIS — D225 Melanocytic nevi of trunk: Secondary | ICD-10-CM | POA: Diagnosis not present

## 2021-05-23 DIAGNOSIS — L578 Other skin changes due to chronic exposure to nonionizing radiation: Secondary | ICD-10-CM | POA: Diagnosis not present

## 2021-05-23 DIAGNOSIS — I8393 Asymptomatic varicose veins of bilateral lower extremities: Secondary | ICD-10-CM | POA: Diagnosis not present

## 2021-06-18 ENCOUNTER — Other Ambulatory Visit (HOSPITAL_COMMUNITY): Payer: Self-pay

## 2021-07-16 ENCOUNTER — Other Ambulatory Visit (HOSPITAL_COMMUNITY): Payer: Self-pay

## 2021-08-13 ENCOUNTER — Encounter: Payer: 59 | Admitting: Internal Medicine

## 2021-08-14 ENCOUNTER — Other Ambulatory Visit (HOSPITAL_COMMUNITY): Payer: Self-pay

## 2021-08-14 ENCOUNTER — Other Ambulatory Visit: Payer: Self-pay | Admitting: Internal Medicine

## 2021-08-14 MED ORDER — BUPROPION HCL ER (XL) 150 MG PO TB24
150.0000 mg | ORAL_TABLET | Freq: Every day | ORAL | 5 refills | Status: DC
  Filled 2021-08-14: qty 30, 30d supply, fill #0

## 2021-08-27 ENCOUNTER — Encounter: Payer: Self-pay | Admitting: Internal Medicine

## 2021-08-28 ENCOUNTER — Other Ambulatory Visit (HOSPITAL_COMMUNITY): Payer: Self-pay

## 2021-08-28 ENCOUNTER — Ambulatory Visit (INDEPENDENT_AMBULATORY_CARE_PROVIDER_SITE_OTHER): Payer: 59 | Admitting: Internal Medicine

## 2021-08-28 VITALS — BP 118/64 | HR 61 | Temp 98.6°F | Ht 65.0 in | Wt 128.0 lb

## 2021-08-28 DIAGNOSIS — F3281 Premenstrual dysphoric disorder: Secondary | ICD-10-CM

## 2021-08-28 DIAGNOSIS — Z8262 Family history of osteoporosis: Secondary | ICD-10-CM

## 2021-08-28 DIAGNOSIS — Z0000 Encounter for general adult medical examination without abnormal findings: Secondary | ICD-10-CM | POA: Diagnosis not present

## 2021-08-28 DIAGNOSIS — R9389 Abnormal findings on diagnostic imaging of other specified body structures: Secondary | ICD-10-CM | POA: Insufficient documentation

## 2021-08-28 DIAGNOSIS — O039 Complete or unspecified spontaneous abortion without complication: Secondary | ICD-10-CM | POA: Insufficient documentation

## 2021-08-28 LAB — CBC WITH DIFFERENTIAL/PLATELET
Basophils Absolute: 0 10*3/uL (ref 0.0–0.1)
Basophils Relative: 1 % (ref 0.0–3.0)
Eosinophils Absolute: 0.1 10*3/uL (ref 0.0–0.7)
Eosinophils Relative: 1.3 % (ref 0.0–5.0)
HCT: 40.9 % (ref 36.0–46.0)
Hemoglobin: 13.7 g/dL (ref 12.0–15.0)
Lymphocytes Relative: 31.1 % (ref 12.0–46.0)
Lymphs Abs: 1.4 10*3/uL (ref 0.7–4.0)
MCHC: 33.6 g/dL (ref 30.0–36.0)
MCV: 93.5 fl (ref 78.0–100.0)
Monocytes Absolute: 0.4 10*3/uL (ref 0.1–1.0)
Monocytes Relative: 9.1 % (ref 3.0–12.0)
Neutro Abs: 2.6 10*3/uL (ref 1.4–7.7)
Neutrophils Relative %: 57.5 % (ref 43.0–77.0)
Platelets: 243 10*3/uL (ref 150.0–400.0)
RBC: 4.37 Mil/uL (ref 3.87–5.11)
RDW: 12.7 % (ref 11.5–15.5)
WBC: 4.5 10*3/uL (ref 4.0–10.5)

## 2021-08-28 LAB — LIPID PANEL
Cholesterol: 205 mg/dL — ABNORMAL HIGH (ref 0–200)
HDL: 67.1 mg/dL (ref >39.00)
LDL Cholesterol: 128 mg/dL — ABNORMAL HIGH (ref 0–99)
NonHDL: 138.12
Total CHOL/HDL Ratio: 3
Triglycerides: 49 mg/dL (ref 0.0–149.0)
VLDL: 9.8 mg/dL (ref 0.0–40.0)

## 2021-08-28 LAB — COMPREHENSIVE METABOLIC PANEL
ALT: 20 U/L (ref 0–35)
AST: 19 U/L (ref 0–37)
Albumin: 4.3 g/dL (ref 3.5–5.2)
Alkaline Phosphatase: 41 U/L (ref 39–117)
BUN: 15 mg/dL (ref 6–23)
CO2: 29 mEq/L (ref 19–32)
Calcium: 9.5 mg/dL (ref 8.4–10.5)
Chloride: 103 mEq/L (ref 96–112)
Creatinine, Ser: 0.83 mg/dL (ref 0.40–1.20)
GFR: 88.58 mL/min (ref >60.00)
Glucose, Bld: 91 mg/dL (ref 70–99)
Potassium: 4.4 mEq/L (ref 3.5–5.1)
Sodium: 138 mEq/L (ref 135–145)
Total Bilirubin: 0.7 mg/dL (ref 0.2–1.2)
Total Protein: 7.3 g/dL (ref 6.0–8.3)

## 2021-08-28 LAB — VITAMIN D 25 HYDROXY (VIT D DEFICIENCY, FRACTURES): VITD: 82.13 ng/mL (ref 30.00–100.00)

## 2021-08-28 LAB — TSH: TSH: 1.84 u[IU]/mL (ref 0.35–5.50)

## 2021-08-31 ENCOUNTER — Encounter: Payer: Self-pay | Admitting: Internal Medicine

## 2021-09-03 ENCOUNTER — Other Ambulatory Visit (HOSPITAL_COMMUNITY): Payer: Self-pay

## 2021-09-03 MED ORDER — BUPROPION HCL ER (XL) 150 MG PO TB24
150.0000 mg | ORAL_TABLET | Freq: Every day | ORAL | 3 refills | Status: DC
  Filled 2021-09-03 – 2021-09-13 (×2): qty 90, 90d supply, fill #0
  Filled 2021-12-08: qty 90, 90d supply, fill #1
  Filled 2022-03-07 – 2022-03-19 (×3): qty 90, 90d supply, fill #2
  Filled 2022-06-11: qty 90, 90d supply, fill #3

## 2021-09-07 ENCOUNTER — Other Ambulatory Visit (HOSPITAL_COMMUNITY): Payer: Self-pay

## 2021-09-13 ENCOUNTER — Other Ambulatory Visit (HOSPITAL_COMMUNITY): Payer: Self-pay

## 2021-10-09 ENCOUNTER — Other Ambulatory Visit (HOSPITAL_COMMUNITY): Payer: Self-pay

## 2021-10-09 MED ORDER — NORETHIN ACE-ETH ESTRAD-FE 1-20 MG-MCG(24) PO CHEW
CHEWABLE_TABLET | ORAL | 1 refills | Status: DC
  Filled 2021-10-09: qty 84, 84d supply, fill #0
  Filled 2021-12-14: qty 84, 84d supply, fill #1

## 2021-11-29 ENCOUNTER — Other Ambulatory Visit (HOSPITAL_COMMUNITY): Payer: Self-pay

## 2021-11-29 DIAGNOSIS — Z1389 Encounter for screening for other disorder: Secondary | ICD-10-CM | POA: Diagnosis not present

## 2021-11-29 DIAGNOSIS — Z3041 Encounter for surveillance of contraceptive pills: Secondary | ICD-10-CM | POA: Diagnosis not present

## 2021-11-29 DIAGNOSIS — Z01419 Encounter for gynecological examination (general) (routine) without abnormal findings: Secondary | ICD-10-CM | POA: Diagnosis not present

## 2021-11-29 MED ORDER — NORETHIN ACE-ETH ESTRAD-FE 1-20 MG-MCG(24) PO CHEW
1.0000 | CHEWABLE_TABLET | Freq: Every day | ORAL | 6 refills | Status: DC
  Filled 2021-11-29: qty 84, 84d supply, fill #0
  Filled 2021-12-21: qty 84, 63d supply, fill #0
  Filled 2022-02-20: qty 84, 63d supply, fill #1
  Filled 2022-04-24: qty 112, 84d supply, fill #2
  Filled 2022-07-17: qty 112, 84d supply, fill #3
  Filled 2022-09-10 – 2022-10-05 (×2): qty 112, 84d supply, fill #4
  Filled 2022-11-09: qty 84, 84d supply, fill #5

## 2021-12-04 DIAGNOSIS — Z1231 Encounter for screening mammogram for malignant neoplasm of breast: Secondary | ICD-10-CM | POA: Diagnosis not present

## 2021-12-08 ENCOUNTER — Other Ambulatory Visit (HOSPITAL_COMMUNITY): Payer: Self-pay

## 2021-12-14 ENCOUNTER — Other Ambulatory Visit (HOSPITAL_COMMUNITY): Payer: Self-pay

## 2021-12-22 ENCOUNTER — Other Ambulatory Visit (HOSPITAL_COMMUNITY): Payer: Self-pay

## 2022-02-04 IMAGING — MR MR KNEE*L* W/O CM
7 series · 40 of 40 positions shown · non-contrast
Comparison: Radiographs from 03/23/2020

CLINICAL DATA: Chronic bilateral knee pain especially in the
peripatellar region.

EXAM:
MRI OF THE LEFT KNEE WITHOUT CONTRAST
TECHNIQUE: Multiplanar, multisequence MR imaging of the knee was performed. No
intravenous contrast was administered.

[Series 5: T2 fat-sat · axial · 4.0mm · 0.53mm/px · z∈[-80,+65]mm · 6 of 30 slices shown (1 of 3)]
[im 1/30]
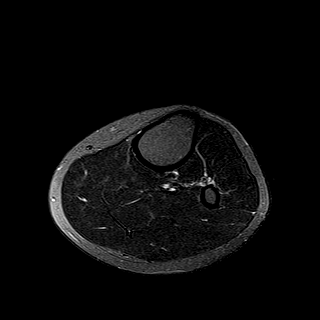
[im 6/30]
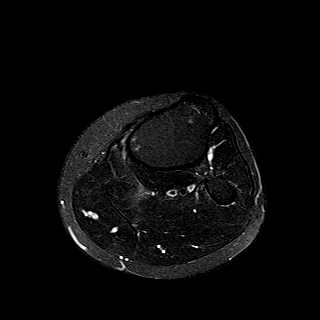
[im 12/30]
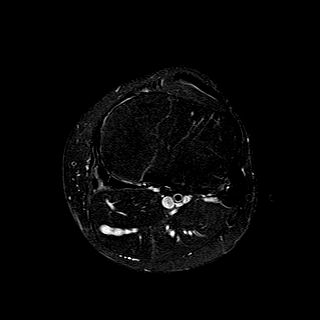
[im 18/30]
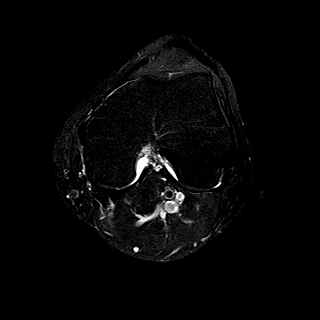
[im 24/30]
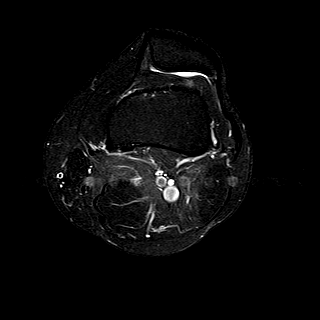
[im 30/30]
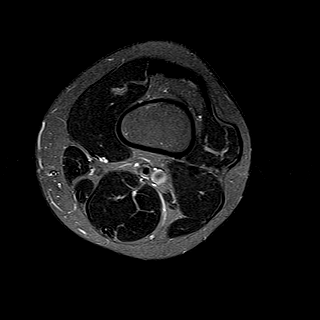

[Series 6: T1 · coronal · 4.0mm · 0.62mm/px · 6 of 28 slices shown]
[im 1/28]
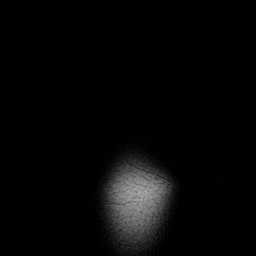
[im 6/28]
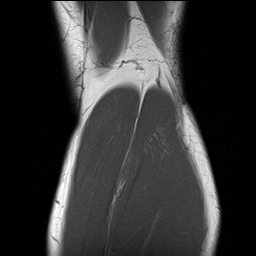
[im 11/28]
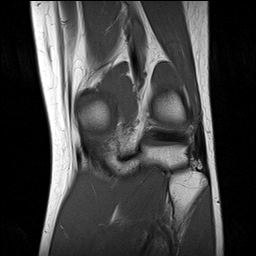
[im 17/28]
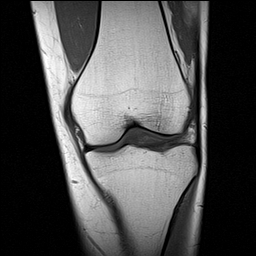
[im 22/28]
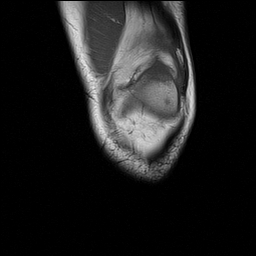
[im 28/28]
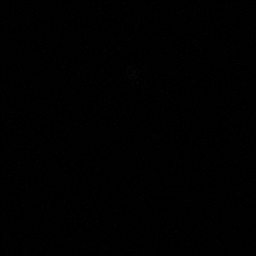

[Series 7: T2 fat-sat · coronal · 4.0mm · 0.62mm/px · 6 of 28 slices shown (2 of 3)]
[im 1/28]
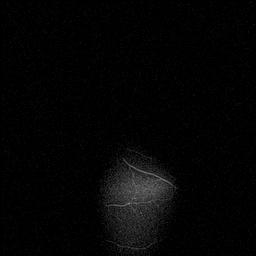
[im 6/28]
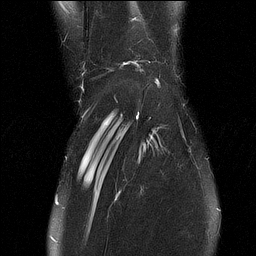
[im 11/28]
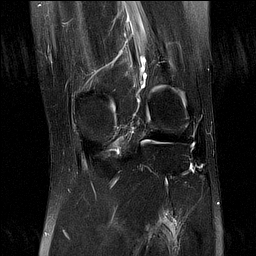
[im 17/28]
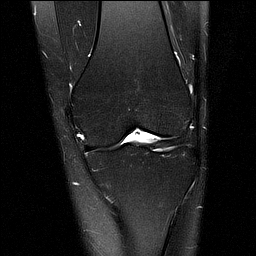
[im 22/28]
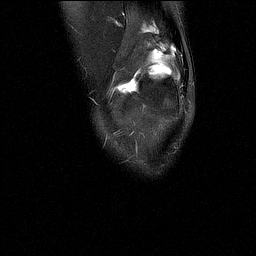
[im 28/28]
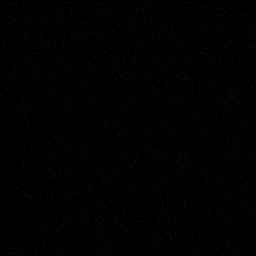

[Series 8: PD fat-sat · coronal · 4.0mm · 0.62mm/px · 6 of 28 slices shown (1 of 3)]
[im 1/28]
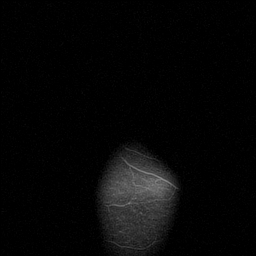
[im 6/28]
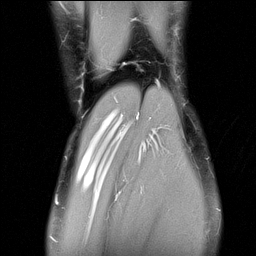
[im 11/28]
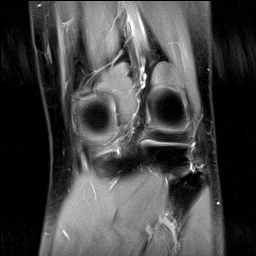
[im 17/28]
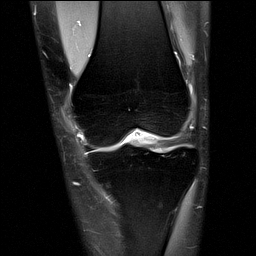
[im 22/28]
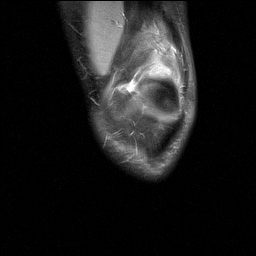
[im 28/28]
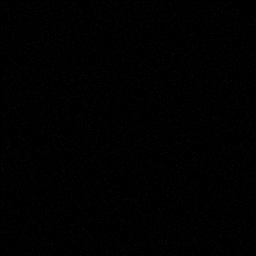

[Series 9: PD fat-sat · sagittal · 3.0mm · 0.62mm/px · 6 of 27 slices shown (2 of 3)]
[im 1/27]
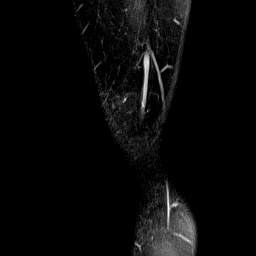
[im 6/27]
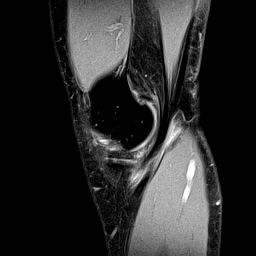
[im 11/27]
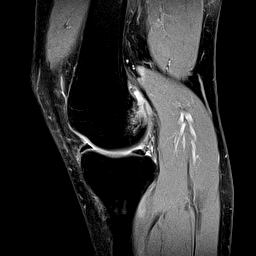
[im 16/27]
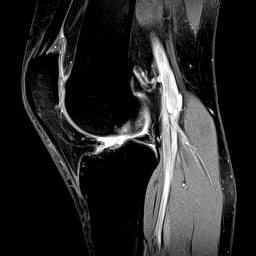
[im 21/27]
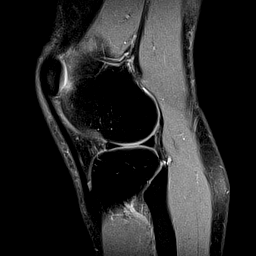
[im 27/27]
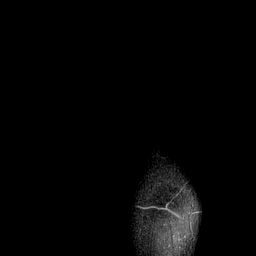

[Series 10: T2 fat-sat · sagittal · 3.0mm · 0.62mm/px · 6 of 27 slices shown (3 of 3)]
[im 1/27]
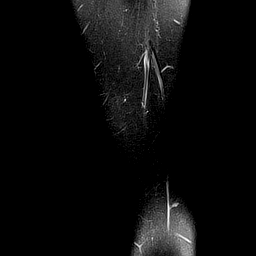
[im 6/27]
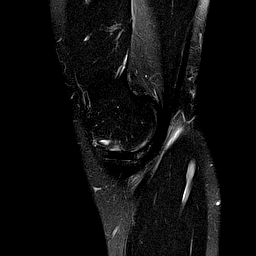
[im 11/27]
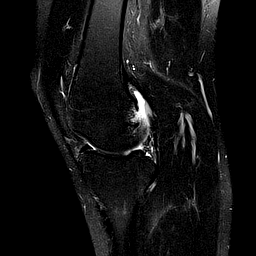
[im 16/27]
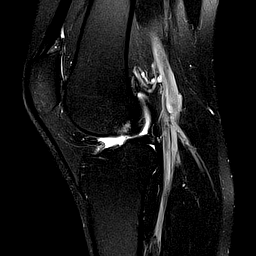
[im 21/27]
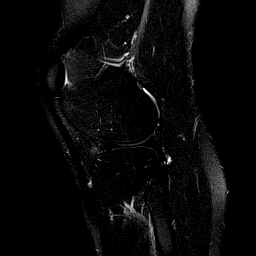
[im 27/27]
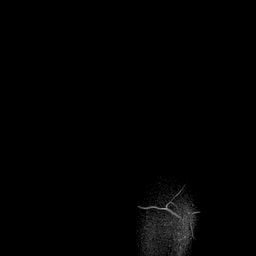

[Series 11: PD fat-sat · coronal · 2.0mm · 0.62mm/px · 4 of 19 slices shown (3 of 3)]
[im 1/19]
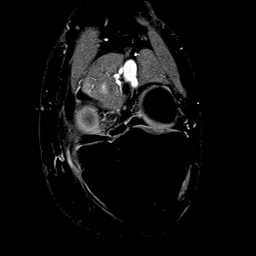
[im 7/19]
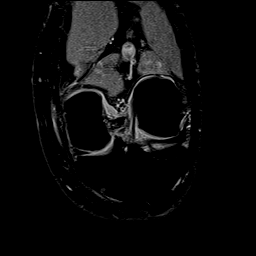
[im 13/19]
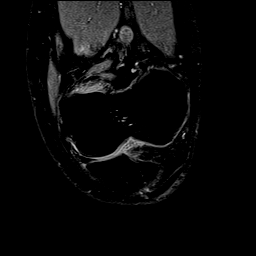
[im 19/19]
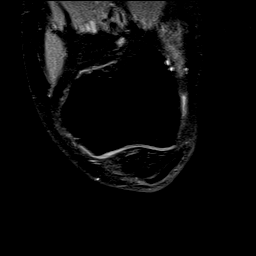

[40 of 40 positions shown; findings below may reference images not displayed]

FINDINGS: MENISCI

Medial meniscus:  Unremarkable

Lateral meniscus:  Unremarkable

LIGAMENTS

Cruciates:  Unremarkable

Collaterals:  Unremarkable

CARTILAGE

Patellofemoral: Partial thickness chondral thinning inferiorly along
the lateral patellar facet with underlying mild subcortical marrow
edema as on image 10 of series 5. Moderate chondral thinning along
the femoral trochlear groove. Small focus of subchondral edema
laterally along the femoral trochlear groove, image 12 series 5.

Medial:  Minimal degenerative chondral thinning.

Lateral:  Unremarkable

Joint: Small knee effusion with suspected mild focal synovitis just
above the patella on image 11 series 9. Subtle edema in Hoffa's fat
pad just below the lateral patellar facet which can be seen in
setting of patellar tendon-lateral femoral condyle friction
syndrome.

Popliteal Fossa: Small Baker's cyst with trace edema tracking along
the adjacent pes anserinus.

Extensor Mechanism: There is some lateral patellar tilt and
potentially a mild lateral excessive pressure. Tibial
tubercle-trochlear groove distance 1.8 cm.

Bones: No significant extra-articular osseous abnormalities
identified.

Other: No supplemental non-categorized findings.
IMPRESSION: 1. Partial thickness chondral thinning inferiorly along the lateral
patellar facet with underlying mild subcortical marrow edema.
Moderate chondral thinning along the femoral trochlear groove.
2. Small knee effusion with suspected mild focal synovitis just
above the patella.
3. Subtle edema in Hoffa's fat pad just below the lateral patellar
facet which can be seen in setting of patellar tendon-lateral
femoral condyle friction syndrome.
4. Small Baker's cyst with trace edema tracking along the adjacent
pes anserinus.
5. Minimal degenerative chondral thinning in the medial compartment.

## 2022-02-04 IMAGING — MR MR KNEE*R* W/O CM
7 series · 40 of 40 positions shown · non-contrast
Comparison: Radiographs 03/23/2020

CLINICAL DATA: Chronic bilateral knee pain, worsening over the last
3 months.

EXAM:
MRI OF THE RIGHT KNEE WITHOUT CONTRAST
TECHNIQUE: Multiplanar, multisequence MR imaging of the knee was performed. No
intravenous contrast was administered.

[Series 3: T2 fat-sat · axial · 4.0mm · 0.53mm/px · z∈[-66,+64]mm · 6 of 27 slices shown (1 of 3)]
[im 1/27]
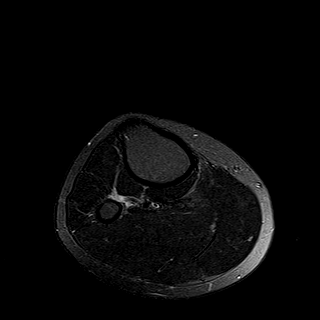
[im 6/27]
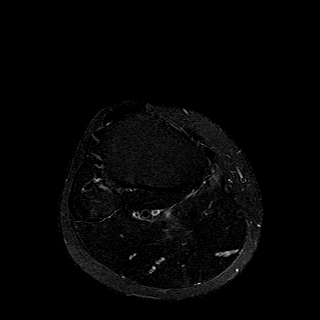
[im 11/27]
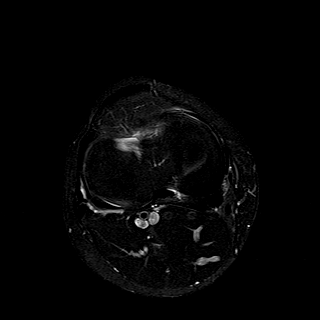
[im 16/27]
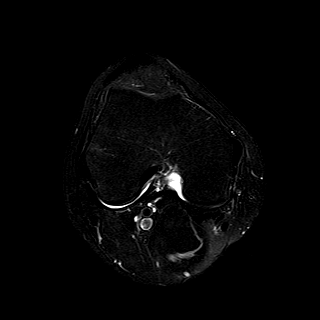
[im 21/27]
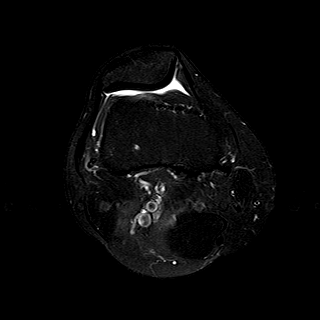
[im 27/27]
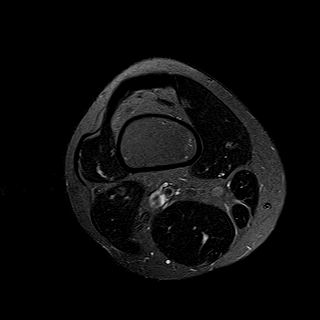

[Series 4: T1 · coronal · 4.0mm · 0.62mm/px · 6 of 30 slices shown]
[im 1/30]
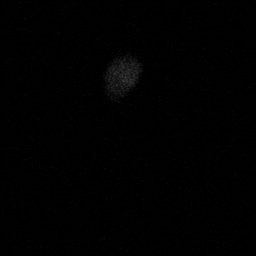
[im 6/30]
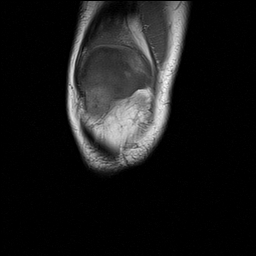
[im 12/30]
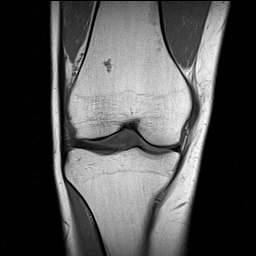
[im 18/30]
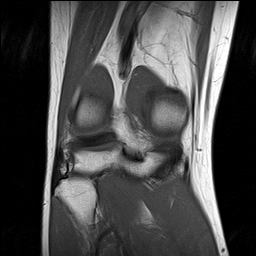
[im 24/30]
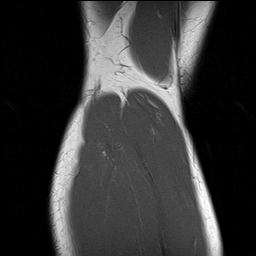
[im 30/30]
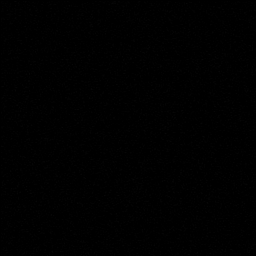

[Series 5: T2 fat-sat · coronal · 4.0mm · 0.62mm/px · 6 of 30 slices shown (2 of 3)]
[im 1/30]
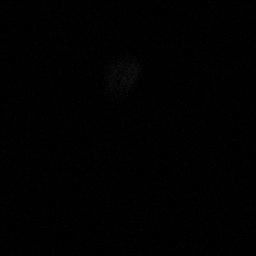
[im 6/30]
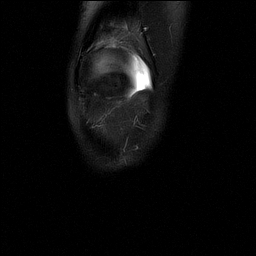
[im 12/30]
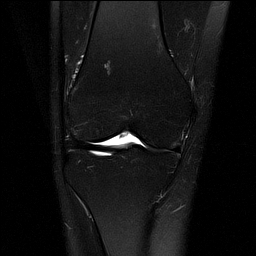
[im 18/30]
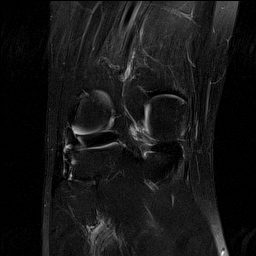
[im 24/30]
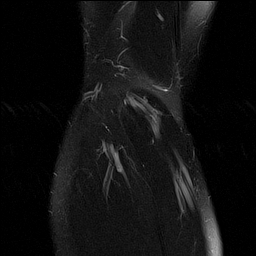
[im 30/30]
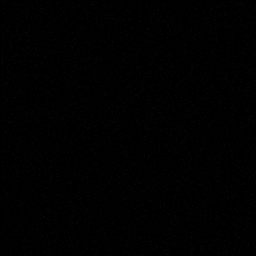

[Series 6: PD fat-sat · sagittal · 3.0mm · 0.62mm/px · 6 of 28 slices shown (1 of 3)]
[im 1/28]
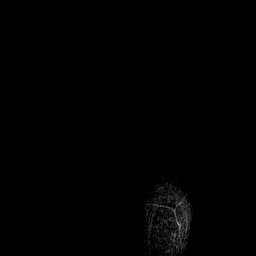
[im 6/28]
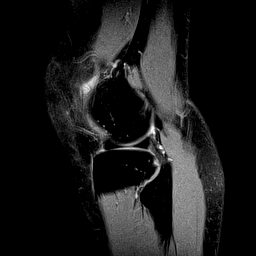
[im 11/28]
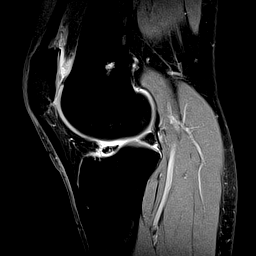
[im 17/28]
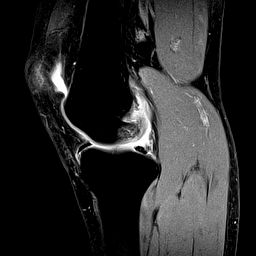
[im 22/28]
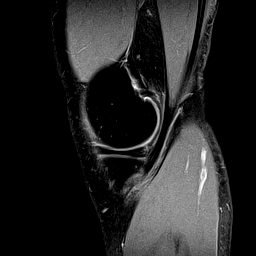
[im 28/28]
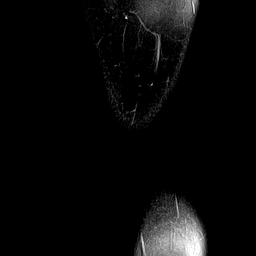

[Series 7: PD fat-sat · coronal · 4.0mm · 0.62mm/px · 6 of 30 slices shown (2 of 3)]
[im 1/30]
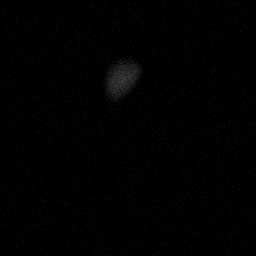
[im 6/30]
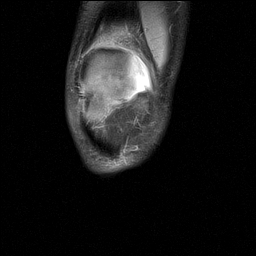
[im 12/30]
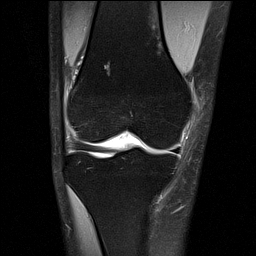
[im 18/30]
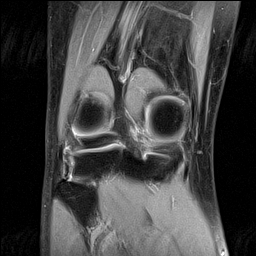
[im 24/30]
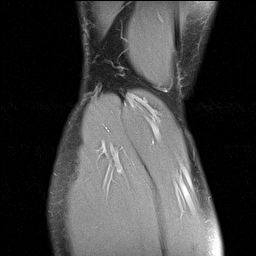
[im 30/30]
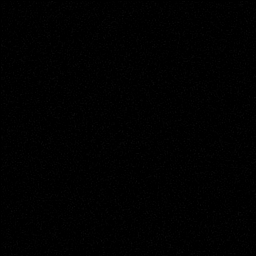

[Series 8: T2 fat-sat · sagittal · 3.0mm · 0.62mm/px · 6 of 28 slices shown (3 of 3)]
[im 1/28]
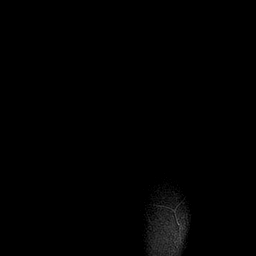
[im 6/28]
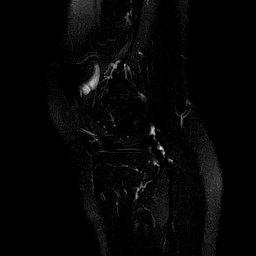
[im 11/28]
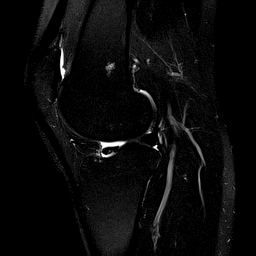
[im 17/28]
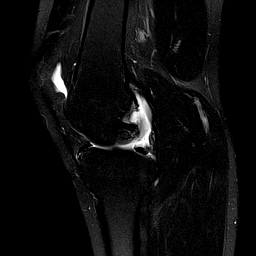
[im 22/28]
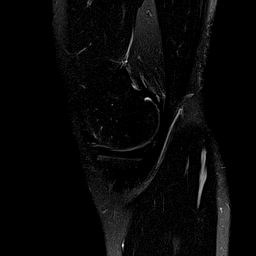
[im 28/28]
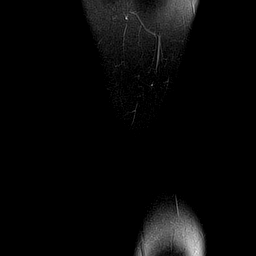

[Series 9: PD fat-sat · coronal · 2.0mm · 0.62mm/px · 4 of 19 slices shown (3 of 3)]
[im 1/19]
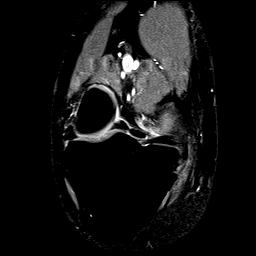
[im 7/19]
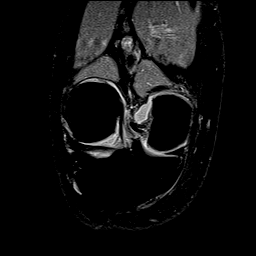
[im 13/19]
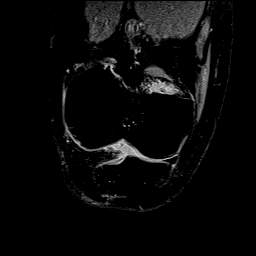
[im 19/19]
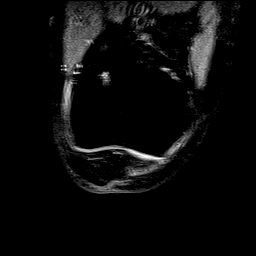

[40 of 40 positions shown; findings below may reference images not displayed]

FINDINGS: MENISCI

Medial meniscus:  Unremarkable

Lateral meniscus:  Unremarkable

LIGAMENTS

Cruciates:  Unremarkable

Collaterals:  Unremarkable

CARTILAGE

Patellofemoral: Mild chondral edema along portions of the lateral
patellar facet with some associated mild chondral thinning along the
lateral patellar facet.

Medial:  Mild degenerative chondral thinning.

Lateral:  Unremarkable

Joint:  Small knee effusion.

Popliteal Fossa:  Unremarkable

Extensor Mechanism: Lateral patellar tilt with shallow femoral
trochlear groove. Cannot exclude mild excessive lateral pressure.

Bones:  Small enchondroma in the distal femoral metaphysis.

Other: No supplemental non-categorized findings.
IMPRESSION: 1. Lateral patellar tilt with shallow femoral trochlear groove.
Cannot exclude mild excessive lateral pressure.
2. Small knee effusion.
3. Mild degenerative chondral thinning in the medial compartment and
along portions of the lateral patellar facet.
4. Small enchondroma in the distal femoral metaphysis.

## 2022-02-20 ENCOUNTER — Other Ambulatory Visit (HOSPITAL_COMMUNITY): Payer: Self-pay

## 2022-03-07 ENCOUNTER — Other Ambulatory Visit (HOSPITAL_COMMUNITY): Payer: Self-pay

## 2022-03-11 ENCOUNTER — Encounter (HOSPITAL_COMMUNITY): Payer: Self-pay

## 2022-03-11 ENCOUNTER — Other Ambulatory Visit (HOSPITAL_COMMUNITY): Payer: Self-pay

## 2022-03-13 ENCOUNTER — Other Ambulatory Visit: Payer: Self-pay

## 2022-03-18 ENCOUNTER — Other Ambulatory Visit (HOSPITAL_COMMUNITY): Payer: Self-pay

## 2022-03-19 ENCOUNTER — Other Ambulatory Visit: Payer: Self-pay

## 2022-03-19 ENCOUNTER — Other Ambulatory Visit (HOSPITAL_BASED_OUTPATIENT_CLINIC_OR_DEPARTMENT_OTHER): Payer: Self-pay

## 2022-03-19 ENCOUNTER — Other Ambulatory Visit (HOSPITAL_COMMUNITY): Payer: Self-pay

## 2022-04-24 ENCOUNTER — Other Ambulatory Visit: Payer: Self-pay

## 2022-04-24 ENCOUNTER — Other Ambulatory Visit (HOSPITAL_BASED_OUTPATIENT_CLINIC_OR_DEPARTMENT_OTHER): Payer: Self-pay

## 2022-04-26 ENCOUNTER — Other Ambulatory Visit (HOSPITAL_COMMUNITY): Payer: Self-pay

## 2022-06-11 ENCOUNTER — Other Ambulatory Visit: Payer: Self-pay

## 2022-06-19 DIAGNOSIS — L578 Other skin changes due to chronic exposure to nonionizing radiation: Secondary | ICD-10-CM | POA: Diagnosis not present

## 2022-06-19 DIAGNOSIS — L814 Other melanin hyperpigmentation: Secondary | ICD-10-CM | POA: Diagnosis not present

## 2022-06-19 DIAGNOSIS — D225 Melanocytic nevi of trunk: Secondary | ICD-10-CM | POA: Diagnosis not present

## 2022-07-17 ENCOUNTER — Other Ambulatory Visit: Payer: Self-pay

## 2022-09-10 ENCOUNTER — Other Ambulatory Visit: Payer: Self-pay | Admitting: Internal Medicine

## 2022-09-11 ENCOUNTER — Other Ambulatory Visit (HOSPITAL_COMMUNITY): Payer: Self-pay

## 2022-09-11 ENCOUNTER — Other Ambulatory Visit: Payer: Self-pay

## 2022-09-11 MED ORDER — BUPROPION HCL ER (XL) 150 MG PO TB24
150.0000 mg | ORAL_TABLET | Freq: Every day | ORAL | 0 refills | Status: DC
  Filled 2022-09-11 – 2022-09-13 (×2): qty 30, 30d supply, fill #0

## 2022-09-13 ENCOUNTER — Other Ambulatory Visit (HOSPITAL_COMMUNITY): Payer: Self-pay

## 2022-09-13 ENCOUNTER — Other Ambulatory Visit: Payer: Self-pay

## 2022-10-02 ENCOUNTER — Encounter (INDEPENDENT_AMBULATORY_CARE_PROVIDER_SITE_OTHER): Payer: Self-pay

## 2022-10-05 ENCOUNTER — Other Ambulatory Visit (HOSPITAL_COMMUNITY): Payer: Self-pay

## 2022-10-05 ENCOUNTER — Other Ambulatory Visit: Payer: Self-pay | Admitting: Internal Medicine

## 2022-10-07 ENCOUNTER — Other Ambulatory Visit (HOSPITAL_COMMUNITY): Payer: Self-pay

## 2022-10-07 ENCOUNTER — Other Ambulatory Visit: Payer: Self-pay

## 2022-10-07 MED ORDER — BUPROPION HCL ER (XL) 150 MG PO TB24
150.0000 mg | ORAL_TABLET | Freq: Every day | ORAL | 0 refills | Status: DC
  Filled 2022-10-07 – 2022-10-11 (×2): qty 30, 30d supply, fill #0

## 2022-10-11 ENCOUNTER — Other Ambulatory Visit (HOSPITAL_COMMUNITY): Payer: Self-pay

## 2022-10-27 ENCOUNTER — Encounter: Payer: Self-pay | Admitting: Internal Medicine

## 2022-10-27 NOTE — Progress Notes (Unsigned)
Subjective:    Patient ID: Amanda Costa, female    DOB: 20-Aug-1980, 42 y.o.   MRN: [id number]      HPI Amanda Costa is here for a Physical exam and her chronic medical problems.    Doing well.  No concerns.   Medications and allergies reviewed with patient and updated if appropriate.  Current Outpatient Medications on File Prior to Visit  Medication Sig Dispense Refill   Cholecalciferol (VITAMIN D3) 50 MCG (2000 UT) TABS Take 50 mcg by mouth daily.     [employer] SUNSCREEN SPF 30 EX apply topically to face and body daily for 30     Multiple Vitamin (MULTI-VITAMIN DAILY PO) Multi Vitamin     Norethin Ace-Eth Estrad-FE 1-20 MG-MCG(24) CHEW CHEW 1 TABLET BY MOUTH ONCE A DAY. TAKE CONTINUOUSLY, SKIP PLACEBOS. 84 tablet 6   No current facility-administered medications on file prior to visit.    Review of Systems  Constitutional:  Negative for fever.  Eyes:  Negative for visual disturbance.  Respiratory:  Negative for cough, shortness of breath and wheezing.   Cardiovascular:  Negative for chest pain, palpitations and leg swelling.  Gastrointestinal:  Negative for abdominal pain, blood in stool, constipation and diarrhea.       No gerd  Genitourinary:  Negative for dysuria and hematuria.  Musculoskeletal:  Negative for arthralgias and back pain.  Skin:  Negative for rash.  Neurological:  Negative for light-headedness and headaches.  Psychiatric/Behavioral:  Negative for dysphoric mood. The patient is not nervous/anxious.        Objective:   Vitals:   10/28/22 1259  BP: 110/74  Pulse: 67  Temp: 98.8 F (37.1 C)  SpO2: 100%   Filed Weights   10/28/22 1259  Weight: 132 lb 9.6 oz (60.1 kg)   Body mass index is 22.07 kg/m.  BP Readings from Last 3 Encounters:  10/28/22 110/74  08/28/21 118/64  08/29/20 118/70    Wt Readings from Last 3 Encounters:  10/28/22 132 lb 9.6 oz (60.1 kg)  08/28/21 128 lb (58.1 kg)  08/29/20 121 lb (54.9 kg)       Physical  Exam Constitutional: She appears well-developed and well-nourished. No distress.  HENT:  Head: Normocephalic and atraumatic.  Right Ear: External ear normal. Normal ear canal and TM Left Ear: External ear normal.  Normal ear canal and TM Mouth/Throat: Oropharynx is clear and moist.  Eyes: Conjunctivae normal.  Neck: Neck supple. No tracheal deviation present. No thyromegaly present.  No carotid bruit  Cardiovascular: Normal rate, regular rhythm and normal heart sounds.   No murmur heard.  No edema. Pulmonary/Chest: Effort normal and breath sounds normal. No respiratory distress. She has no wheezes. She has no rales.  Breast: deferred   Abdominal: Soft. She exhibits no distension. There is no tenderness.  Lymphadenopathy: She has no cervical adenopathy.  Skin: Skin is warm and dry. She is not diaphoretic.  Psychiatric: She has a normal mood and affect. Her behavior is normal.     Lab Results  Component Value Date   WBC 4.5 08/28/2021   HGB 13.7 08/28/2021   HCT 40.9 08/28/2021   PLT 243.0 08/28/2021   GLUCOSE 91 08/28/2021   CHOL 205 (H) 08/28/2021   TRIG 49.0 08/28/2021   HDL 67.10 08/28/2021   LDLCALC 128 (H) 08/28/2021   ALT 20 08/28/2021   AST 19 08/28/2021   NA 138 08/28/2021   K 4.4 08/28/2021   CL 103 08/28/2021   CREATININE 0.83  08/28/2021   BUN 15 08/28/2021   CO2 29 08/28/2021   TSH 1.84 08/28/2021   HGBA1C 4.9 01/15/2017         Assessment & Plan:   Physical exam: Screening blood work  ordered Exercise regular Weight normal Substance abuse  none   Reviewed recommended immunizations.  Will get flu shot at work   Health Maintenance  Topic Date Due   COVID-19 Vaccine (1) Never done   INFLUENZA VACCINE  06/02/2023 (Originally 10/03/2022)   DTaP/Tdap/Td (3 - Td or Tdap) 02/15/2025   PAP SMEAR-Modifier  09/26/2025   HIV Screening  Completed   HPV VACCINES  Aged Out   Hepatitis C Screening  Discontinued          See Problem List for  Assessment and Plan of chronic medical problems.

## 2022-10-27 NOTE — Patient Instructions (Addendum)

## 2022-10-28 ENCOUNTER — Ambulatory Visit (INDEPENDENT_AMBULATORY_CARE_PROVIDER_SITE_OTHER): Payer: 59 | Admitting: Internal Medicine

## 2022-10-28 ENCOUNTER — Other Ambulatory Visit (HOSPITAL_COMMUNITY): Payer: Self-pay

## 2022-10-28 VITALS — BP 110/74 | HR 67 | Temp 98.8°F | Ht 65.0 in | Wt 132.6 lb

## 2022-10-28 DIAGNOSIS — Z0000 Encounter for general adult medical examination without abnormal findings: Secondary | ICD-10-CM

## 2022-10-28 DIAGNOSIS — E559 Vitamin D deficiency, unspecified: Secondary | ICD-10-CM

## 2022-10-28 DIAGNOSIS — F3281 Premenstrual dysphoric disorder: Secondary | ICD-10-CM

## 2022-10-28 LAB — VITAMIN D 25 HYDROXY (VIT D DEFICIENCY, FRACTURES): VITD: 63.28 ng/mL (ref 30.00–100.00)

## 2022-10-28 LAB — CBC WITH DIFFERENTIAL/PLATELET
Basophils Absolute: 0 10*3/uL (ref 0.0–0.1)
Basophils Relative: 0.5 % (ref 0.0–3.0)
Eosinophils Absolute: 0.1 10*3/uL (ref 0.0–0.7)
Eosinophils Relative: 1.4 % (ref 0.0–5.0)
HCT: 39.7 % (ref 36.0–46.0)
Hemoglobin: 13 g/dL (ref 12.0–15.0)
Lymphocytes Relative: 28.9 % (ref 12.0–46.0)
Lymphs Abs: 1.7 10*3/uL (ref 0.7–4.0)
MCHC: 32.9 g/dL (ref 30.0–36.0)
MCV: 93.4 fl (ref 78.0–100.0)
Monocytes Absolute: 0.5 10*3/uL (ref 0.1–1.0)
Monocytes Relative: 8.7 % (ref 3.0–12.0)
Neutro Abs: 3.7 10*3/uL (ref 1.4–7.7)
Neutrophils Relative %: 60.5 % (ref 43.0–77.0)
Platelets: 276 10*3/uL (ref 150.0–400.0)
RBC: 4.25 Mil/uL (ref 3.87–5.11)
RDW: 12.5 % (ref 11.5–15.5)
WBC: 6.1 10*3/uL (ref 4.0–10.5)

## 2022-10-28 LAB — LIPID PANEL
Cholesterol: 191 mg/dL (ref 0–200)
HDL: 57.7 mg/dL (ref >39.00)
LDL Cholesterol: 113 mg/dL — ABNORMAL HIGH (ref 0–99)
NonHDL: 133.19
Total CHOL/HDL Ratio: 3
Triglycerides: 103 mg/dL (ref 0.0–149.0)
VLDL: 20.6 mg/dL (ref 0.0–40.0)

## 2022-10-28 LAB — COMPREHENSIVE METABOLIC PANEL
ALT: 23 U/L (ref 0–35)
AST: 22 U/L (ref 0–37)
Albumin: 4 g/dL (ref 3.5–5.2)
Alkaline Phosphatase: 41 U/L (ref 39–117)
BUN: 20 mg/dL (ref 6–23)
CO2: 29 mEq/L (ref 19–32)
Calcium: 9.1 mg/dL (ref 8.4–10.5)
Chloride: 103 mEq/L (ref 96–112)
Creatinine, Ser: 0.71 mg/dL (ref 0.40–1.20)
GFR: 105.97 mL/min (ref >60.00)
Glucose, Bld: 97 mg/dL (ref 70–99)
Potassium: 4.1 mEq/L (ref 3.5–5.1)
Sodium: 140 mEq/L (ref 135–145)
Total Bilirubin: 0.3 mg/dL (ref 0.2–1.2)
Total Protein: 7.1 g/dL (ref 6.0–8.3)

## 2022-10-28 LAB — TSH: TSH: 2.99 u[IU]/mL (ref 0.35–5.50)

## 2022-10-28 MED ORDER — BUPROPION HCL ER (XL) 150 MG PO TB24
150.0000 mg | ORAL_TABLET | Freq: Every day | ORAL | 3 refills | Status: DC
  Filled 2022-10-28 – 2022-11-09 (×2): qty 90, 90d supply, fill #0
  Filled 2023-02-06: qty 90, 90d supply, fill #1
  Filled 2023-04-23: qty 90, 90d supply, fill #2
  Filled 2023-07-24: qty 90, 90d supply, fill #3

## 2022-10-28 NOTE — Assessment & Plan Note (Signed)
Chronic Taking vitamin d daily Check vitamin d level  

## 2022-10-28 NOTE — Addendum Note (Signed)
Addended by: Pincus Sanes on: 10/28/2022 01:41 PM   Modules accepted: Orders

## 2022-10-28 NOTE — Assessment & Plan Note (Signed)
Chronic Controlled, stable Continue wellbutrin xl 150 mg daily

## 2022-11-09 ENCOUNTER — Other Ambulatory Visit (HOSPITAL_COMMUNITY): Payer: Self-pay

## 2022-11-11 ENCOUNTER — Other Ambulatory Visit: Payer: Self-pay

## 2022-11-11 ENCOUNTER — Other Ambulatory Visit (HOSPITAL_COMMUNITY): Payer: Self-pay

## 2022-12-26 ENCOUNTER — Other Ambulatory Visit (HOSPITAL_COMMUNITY): Payer: Self-pay

## 2022-12-26 MED ORDER — NORETHIN ACE-ETH ESTRAD-FE 1-20 MG-MCG(24) PO CHEW
1.0000 | CHEWABLE_TABLET | Freq: Every day | ORAL | 0 refills | Status: DC
  Filled 2022-12-26: qty 84, 84d supply, fill #0

## 2023-01-03 ENCOUNTER — Other Ambulatory Visit (HOSPITAL_COMMUNITY): Payer: Self-pay

## 2023-01-03 DIAGNOSIS — Z3041 Encounter for surveillance of contraceptive pills: Secondary | ICD-10-CM | POA: Diagnosis not present

## 2023-01-03 DIAGNOSIS — Z01419 Encounter for gynecological examination (general) (routine) without abnormal findings: Secondary | ICD-10-CM | POA: Diagnosis not present

## 2023-01-03 DIAGNOSIS — Z1231 Encounter for screening mammogram for malignant neoplasm of breast: Secondary | ICD-10-CM | POA: Diagnosis not present

## 2023-01-03 DIAGNOSIS — Z6822 Body mass index (BMI) 22.0-22.9, adult: Secondary | ICD-10-CM | POA: Diagnosis not present

## 2023-01-03 DIAGNOSIS — Z1389 Encounter for screening for other disorder: Secondary | ICD-10-CM | POA: Diagnosis not present

## 2023-01-03 MED ORDER — NORETHIN ACE-ETH ESTRAD-FE 1-20 MG-MCG(24) PO CHEW
1.0000 | CHEWABLE_TABLET | Freq: Every day | ORAL | 5 refills | Status: DC
  Filled 2023-01-03 – 2023-03-21 (×2): qty 84, 84d supply, fill #0
  Filled 2023-06-22: qty 84, 84d supply, fill #1
  Filled 2023-07-24 – 2023-09-09 (×3): qty 84, 84d supply, fill #2
  Filled 2023-11-20: qty 84, 84d supply, fill #3

## 2023-01-16 ENCOUNTER — Other Ambulatory Visit (HOSPITAL_COMMUNITY): Payer: Self-pay

## 2023-01-16 ENCOUNTER — Encounter: Payer: Self-pay | Admitting: Internal Medicine

## 2023-01-16 MED ORDER — AZITHROMYCIN 500 MG PO TABS
500.0000 mg | ORAL_TABLET | Freq: Every day | ORAL | 0 refills | Status: DC
  Filled 2023-01-16: qty 3, 3d supply, fill #0

## 2023-02-06 ENCOUNTER — Other Ambulatory Visit: Payer: Self-pay

## 2023-03-21 ENCOUNTER — Other Ambulatory Visit: Payer: Self-pay

## 2023-04-23 ENCOUNTER — Other Ambulatory Visit (HOSPITAL_COMMUNITY): Payer: Self-pay

## 2023-06-23 ENCOUNTER — Other Ambulatory Visit (HOSPITAL_COMMUNITY): Payer: Self-pay

## 2023-06-23 ENCOUNTER — Other Ambulatory Visit: Payer: Self-pay

## 2023-07-15 DIAGNOSIS — L578 Other skin changes due to chronic exposure to nonionizing radiation: Secondary | ICD-10-CM | POA: Diagnosis not present

## 2023-07-15 DIAGNOSIS — D225 Melanocytic nevi of trunk: Secondary | ICD-10-CM | POA: Diagnosis not present

## 2023-07-15 DIAGNOSIS — L814 Other melanin hyperpigmentation: Secondary | ICD-10-CM | POA: Diagnosis not present

## 2023-07-25 ENCOUNTER — Other Ambulatory Visit (HOSPITAL_COMMUNITY): Payer: Self-pay

## 2023-07-25 ENCOUNTER — Other Ambulatory Visit: Payer: Self-pay

## 2023-08-18 ENCOUNTER — Other Ambulatory Visit: Payer: Self-pay

## 2023-09-09 ENCOUNTER — Other Ambulatory Visit (HOSPITAL_COMMUNITY): Payer: Self-pay

## 2023-09-11 DIAGNOSIS — H5213 Myopia, bilateral: Secondary | ICD-10-CM | POA: Diagnosis not present

## 2023-10-27 ENCOUNTER — Other Ambulatory Visit (HOSPITAL_COMMUNITY): Payer: Self-pay

## 2023-10-27 ENCOUNTER — Other Ambulatory Visit: Payer: Self-pay | Admitting: Internal Medicine

## 2023-10-27 ENCOUNTER — Other Ambulatory Visit: Payer: Self-pay

## 2023-10-27 MED ORDER — BUPROPION HCL ER (XL) 150 MG PO TB24
150.0000 mg | ORAL_TABLET | Freq: Every day | ORAL | 3 refills | Status: DC
  Filled 2023-10-27: qty 90, 90d supply, fill #0

## 2023-10-27 NOTE — Telephone Encounter (Signed)
 Copied from CRM #[id number]. Topic: Clinical - Medication Refill >> Oct 27, 2023 12:58 PM [place] J wrote: Medication: buPROPion  (WELLBUTRIN  XL) 150 MG 24 hr tablet  Has the patient contacted their pharmacy? No (Agent: If no, request that the patient contact the pharmacy for the refill. If patient does not wish to contact the pharmacy document the reason why and proceed with request.) (Agent: If yes, when and what did the pharmacy advise?)  This is the patient's preferred pharmacy:   [place] [facility] [address] [place] [address] Phone: [phone] Fax: [phone]  Is this the correct pharmacy for this prescription? Yes If no, delete pharmacy and type the correct one.   Has the prescription been filled recently? No  Is the patient out of the medication? No  Has the patient been seen for an appointment in the last year OR does the patient have an upcoming appointment? Yes  Can we respond through MyChart? Yes  Agent: Please be advised that Rx refills may take up to 3 business days. We ask that you follow-up with your pharmacy.

## 2023-11-21 ENCOUNTER — Other Ambulatory Visit (HOSPITAL_COMMUNITY): Payer: Self-pay

## 2024-01-19 ENCOUNTER — Encounter: Payer: Self-pay | Admitting: Internal Medicine

## 2024-01-19 NOTE — Patient Instructions (Addendum)
 Blood work was ordered.       Medications changes include :   None    A referral was ordered and someone will call you to schedule an appointment.     Return in about 1 year (around 01/19/2025) for Physical Exam.   Health Maintenance, Female Adopting a healthy lifestyle and getting preventive care are important in promoting health and wellness. Ask your health care provider about: The right schedule for you to have regular tests and exams. Things you can do on your own to prevent diseases and keep yourself healthy. What should I know about diet, weight, and exercise? Eat a healthy diet  Eat a diet that includes plenty of vegetables, fruits, low-fat dairy products, and lean protein. Do not eat a lot of foods that are high in solid fats, added sugars, or sodium. Maintain a healthy weight Body mass index (BMI) is used to identify weight problems. It estimates body fat based on height and weight. Your health care provider can help determine your BMI and help you achieve or maintain a healthy weight. Get regular exercise Get regular exercise. This is one of the most important things you can do for your health. Most adults should: Exercise for at least 150 minutes each week. The exercise should increase your heart rate and make you sweat (moderate-intensity exercise). Do strengthening exercises at least twice a week. This is in addition to the moderate-intensity exercise. Spend less time sitting. Even light physical activity can be beneficial. Watch cholesterol and blood lipids Have your blood tested for lipids and cholesterol at 42 years of age, then have this test every 5 years. Have your cholesterol levels checked more often if: Your lipid or cholesterol levels are high. You are older than 43 years of age. You are at high risk for heart disease. What should I know about cancer screening? Depending on your health history and family history, you may need to have cancer  screening at various ages. This may include screening for: Breast cancer. Cervical cancer. Colorectal cancer. Skin cancer. Lung cancer. What should I know about heart disease, diabetes, and high blood pressure? Blood pressure and heart disease High blood pressure causes heart disease and increases the risk of stroke. This is more likely to develop in people who have high blood pressure readings or are overweight. Have your blood pressure checked: Every 3-5 years if you are 81-102 years of age. Every year if you are 80 years old or older. Diabetes Have regular diabetes screenings. This checks your fasting blood sugar level. Have the screening done: Once every three years after age 64 if you are at a normal weight and have a low risk for diabetes. More often and at a younger age if you are overweight or have a high risk for diabetes. What should I know about preventing infection? Hepatitis B If you have a higher risk for hepatitis B, you should be screened for this virus. Talk with your health care provider to find out if you are at risk for hepatitis B infection. Hepatitis C Testing is recommended for: Everyone born from [birth year] through 1964. Anyone with known risk factors for hepatitis C. Sexually transmitted infections (STIs) Get screened for STIs, including gonorrhea and chlamydia, if: You are sexually active and are younger than 42 years of age. You are older than 42 years of age and your health care provider tells you that you are at risk for this type of infection. Your sexual activity has  changed since you were last screened, and you are at increased risk for chlamydia or gonorrhea. Ask your health care provider if you are at risk. Ask your health care provider about whether you are at high risk for HIV. Your health care provider may recommend a prescription medicine to help prevent HIV infection. If you choose to take medicine to prevent HIV, you should first get tested for HIV. You  should then be tested every 3 months for as long as you are taking the medicine. Pregnancy If you are about to stop having your period (premenopausal) and you may become pregnant, seek counseling before you get pregnant. Take 400 to 800 micrograms (mcg) of folic acid every day if you become pregnant. Ask for birth control (contraception) if you want to prevent pregnancy. Osteoporosis and menopause Osteoporosis is a disease in which the bones lose minerals and strength with aging. This can result in bone fractures. If you are 29 years old or older, or if you are at risk for osteoporosis and fractures, ask your health care provider if you should: Be screened for bone loss. Take a calcium or vitamin D  supplement to lower your risk of fractures. Be given hormone replacement therapy (HRT) to treat symptoms of menopause. Follow these instructions at home: Alcohol use Do not drink alcohol if: Your health care provider tells you not to drink. You are pregnant, may be pregnant, or are planning to become pregnant. If you drink alcohol: Limit how much you have to: 0-1 drink a day. Know how much alcohol is in your drink. In the [place] one drink equals one 12 oz bottle of beer (355 mL), one 5 oz glass of wine (148 mL), or one 1 oz glass of hard liquor (44 mL). Lifestyle Do not use any products that contain nicotine or tobacco. These products include cigarettes, chewing tobacco, and vaping devices, such as e-cigarettes. If you need help quitting, ask your health care provider. Do not use street drugs. Do not share needles. Ask your health care provider for help if you need support or information about quitting drugs. General instructions Schedule regular health, dental, and eye exams. Stay current with your vaccines. Tell your health care provider if: You often feel depressed. You have ever been abused or do not feel safe at home. Summary Adopting a healthy lifestyle and getting preventive care are  important in promoting health and wellness. Follow your health care provider's instructions about healthy diet, exercising, and getting tested or screened for diseases. Follow your health care provider's instructions on monitoring your cholesterol and blood pressure. This information is not intended to replace advice given to you by your health care provider. Make sure you discuss any questions you have with your health care provider. Document Revised: 07/10/2020 Document Reviewed: 07/10/2020 Elsevier Patient Education  2024 [employer].

## 2024-01-19 NOTE — Progress Notes (Unsigned)
 Subjective:    Patient ID: Amanda Costa, female    DOB: 1980/10/11, 43 y.o.   MRN: [id number]      HPI Amanda Costa is here for a Physical exam and her chronic medical problems.   Overall doing well.  Has no major concerns.  Has had some increased stress over the past 2 months.  Has weaned herself off the Wellbutrin  and feels like she is doing well.   Medications and allergies reviewed with patient and updated if appropriate.  Current Outpatient Medications on File Prior to Visit  Medication Sig Dispense Refill   Cholecalciferol (VITAMIN D3) 50 MCG (2000 UT) TABS Take 50 mcg by mouth daily.     CVS SUNSCREEN SPF 30 EX apply topically to face and body daily for 30     Multiple Vitamin (MULTI-VITAMIN DAILY PO) Multi Vitamin     Norethin  Ace-Eth Estrad-FE 1-20 MG-MCG(24) CHEW Chew 1 tablet by mouth daily. 84 tablet 5   No current facility-administered medications on file prior to visit.    Review of Systems  Constitutional:  Negative for fever.  Eyes:  Negative for visual disturbance.  Respiratory:  Negative for cough, shortness of breath and wheezing.   Cardiovascular:  Negative for chest pain, palpitations and leg swelling.  Gastrointestinal:  Negative for abdominal pain, blood in stool, constipation and diarrhea.       No gerd  Genitourinary:  Negative for dysuria.  Musculoskeletal:  Positive for arthralgias (knees). Negative for back pain.  Skin:  Negative for rash.  Neurological:  Negative for dizziness, light-headedness and headaches.  Psychiatric/Behavioral:  Negative for dysphoric mood. The patient is not nervous/anxious.        Objective:   Vitals:   01/20/24 1258  BP: 104/70  Pulse: 74  Temp: 98.1 F (36.7 C)  SpO2: 97%   Filed Weights   01/20/24 1258  Weight: 136 lb (61.7 kg)   Body mass index is 22.63 kg/m.  BP Readings from Last 3 Encounters:  01/20/24 104/70  10/28/22 110/74  08/28/21 118/64    Wt Readings from Last 3 Encounters:   01/20/24 136 lb (61.7 kg)  10/28/22 132 lb 9.6 oz (60.1 kg)  08/28/21 128 lb (58.1 kg)       Physical Exam Constitutional: She appears well-developed and well-nourished. No distress.  HENT:  Head: Normocephalic and atraumatic.  Right Ear: External ear normal. Normal ear canal and TM Left Ear: External ear normal.  Normal ear canal and TM Mouth/Throat: Oropharynx is clear and moist.  Eyes: Conjunctivae normal.  Neck: Neck supple. No tracheal deviation present. No thyromegaly present.  No carotid bruit  Cardiovascular: Normal rate, regular rhythm and normal heart sounds.   No murmur heard.  No edema. Pulmonary/Chest: Effort normal and breath sounds normal. No respiratory distress. She has no wheezes. She has no rales.  Breast: deferred   Abdominal: Soft. She exhibits no distension. There is no tenderness.  Lymphadenopathy: She has no cervical adenopathy.  Skin: Skin is warm and dry. She is not diaphoretic.  Psychiatric: She has a normal mood and affect. Her behavior is normal.     Lab Results  Component Value Date   WBC 6.1 10/28/2022   HGB 13.0 10/28/2022   HCT 39.7 10/28/2022   PLT 276.0 10/28/2022   GLUCOSE 97 10/28/2022   CHOL 191 10/28/2022   TRIG 103.0 10/28/2022   HDL 57.70 10/28/2022   LDLCALC 113 (H) 10/28/2022   ALT 23 10/28/2022   AST 22 10/28/2022  NA 140 10/28/2022   K 4.1 10/28/2022   CL 103 10/28/2022   CREATININE 0.71 10/28/2022   BUN 20 10/28/2022   CO2 29 10/28/2022   TSH 2.99 10/28/2022   HGBA1C 4.9 01/15/2017         Assessment & Plan:   Physical exam: Screening blood work  ordered Exercise  walking dog, power yoga, weights Weight  normal Substance abuse  none   Reviewed recommended immunizations.  Consider HPV vaccine-can think about it and discuss with her gynecologist.   Health Maintenance  Topic Date Due   HPV VACCINES (1 - Risk 3-dose SCDM series) Never done   Mammogram  Never done   Cervical Cancer Screening (HPV/Pap  Cotest)  07/24/2022   COVID-19 Vaccine (1) 02/04/2024 (Originally 11[birth year]/1986)   DTaP/Tdap/Td (3 - Td or Tdap) 02/15/2025   Influenza Vaccine  Completed   HIV Screening  Completed   Pneumococcal Vaccine  Aged Out   Meningococcal B Vaccine  Aged Out   Hepatitis B Vaccines 19-59 Average Risk  Discontinued   Hepatitis C Screening  Discontinued     Mammogram and Pap smear up-to-date-will get reports     See Problem List for Assessment and Plan of chronic medical problems.

## 2024-01-20 ENCOUNTER — Ambulatory Visit (INDEPENDENT_AMBULATORY_CARE_PROVIDER_SITE_OTHER): Admitting: Internal Medicine

## 2024-01-20 VITALS — BP 104/70 | HR 74 | Temp 98.1°F | Ht 65.0 in | Wt 136.0 lb

## 2024-01-20 DIAGNOSIS — Z0000 Encounter for general adult medical examination without abnormal findings: Secondary | ICD-10-CM

## 2024-01-20 DIAGNOSIS — E559 Vitamin D deficiency, unspecified: Secondary | ICD-10-CM

## 2024-01-20 DIAGNOSIS — F3281 Premenstrual dysphoric disorder: Secondary | ICD-10-CM

## 2024-01-27 ENCOUNTER — Other Ambulatory Visit (HOSPITAL_COMMUNITY): Payer: Self-pay

## 2024-01-27 DIAGNOSIS — Z3041 Encounter for surveillance of contraceptive pills: Secondary | ICD-10-CM | POA: Diagnosis not present

## 2024-01-27 DIAGNOSIS — Z130 Encounter for screening for diseases of the blood and blood-forming organs and certain disorders involving the immune mechanism: Secondary | ICD-10-CM | POA: Diagnosis not present

## 2024-01-27 DIAGNOSIS — Z01419 Encounter for gynecological examination (general) (routine) without abnormal findings: Secondary | ICD-10-CM | POA: Diagnosis not present

## 2024-01-27 DIAGNOSIS — Z1389 Encounter for screening for other disorder: Secondary | ICD-10-CM | POA: Diagnosis not present

## 2024-01-27 DIAGNOSIS — Z1231 Encounter for screening mammogram for malignant neoplasm of breast: Secondary | ICD-10-CM | POA: Diagnosis not present

## 2024-01-27 MED ORDER — NORETHIN ACE-ETH ESTRAD-FE 1-20 MG-MCG(24) PO CHEW
1.0000 | CHEWABLE_TABLET | Freq: Every day | ORAL | 5 refills | Status: AC
  Filled 2024-02-18: qty 84, 84d supply, fill #0

## 2024-01-28 ENCOUNTER — Other Ambulatory Visit (HOSPITAL_COMMUNITY): Payer: Self-pay

## 2024-02-18 ENCOUNTER — Other Ambulatory Visit: Payer: Self-pay

## 2024-02-18 ENCOUNTER — Other Ambulatory Visit (HOSPITAL_COMMUNITY): Payer: Self-pay
